# Patient Record
Sex: Female | Born: 1975 | Race: Black or African American | Hispanic: No | Marital: Single | Smoking: Never smoker
Health system: Southern US, Community
[De-identification: ages and names within clinical notes are randomized; demographics above are authoritative.]

## PROBLEM LIST (undated history)

## (undated) DIAGNOSIS — Z8679 Personal history of other diseases of the circulatory system: Secondary | ICD-10-CM

## (undated) DIAGNOSIS — F329 Major depressive disorder, single episode, unspecified: Secondary | ICD-10-CM

## (undated) DIAGNOSIS — F419 Anxiety disorder, unspecified: Secondary | ICD-10-CM

## (undated) DIAGNOSIS — G939 Disorder of brain, unspecified: Secondary | ICD-10-CM

## (undated) DIAGNOSIS — F32A Depression, unspecified: Secondary | ICD-10-CM

## (undated) DIAGNOSIS — J45909 Unspecified asthma, uncomplicated: Secondary | ICD-10-CM

## (undated) DIAGNOSIS — Z9889 Other specified postprocedural states: Secondary | ICD-10-CM

## (undated) HISTORY — PX: CARDIAC ELECTROPHYSIOLOGY MAPPING AND ABLATION: SHX1292

## (undated) HISTORY — DX: Disorder of brain, unspecified: G93.9

## (undated) HISTORY — DX: Depression, unspecified: F32.A

## (undated) HISTORY — DX: Anxiety disorder, unspecified: F41.9

## (undated) HISTORY — DX: Major depressive disorder, single episode, unspecified: F32.9

---

## 1999-01-09 ENCOUNTER — Inpatient Hospital Stay (HOSPITAL_COMMUNITY): Admission: EM | Admit: 1999-01-09 | Discharge: 1999-01-15 | Payer: Self-pay | Admitting: Emergency Medicine

## 1999-01-15 ENCOUNTER — Encounter (HOSPITAL_COMMUNITY): Admission: RE | Admit: 1999-01-15 | Discharge: 1999-04-15 | Payer: Self-pay | Admitting: Psychiatry

## 1999-03-12 ENCOUNTER — Encounter: Payer: Self-pay | Admitting: Emergency Medicine

## 1999-03-12 ENCOUNTER — Emergency Department (HOSPITAL_COMMUNITY): Admission: EM | Admit: 1999-03-12 | Discharge: 1999-03-12 | Payer: Self-pay | Admitting: Emergency Medicine

## 2000-03-24 ENCOUNTER — Emergency Department (HOSPITAL_COMMUNITY): Admission: EM | Admit: 2000-03-24 | Discharge: 2000-03-24 | Payer: Self-pay | Admitting: Emergency Medicine

## 2001-06-14 ENCOUNTER — Emergency Department (HOSPITAL_COMMUNITY): Admission: EM | Admit: 2001-06-14 | Discharge: 2001-06-14 | Payer: Self-pay | Admitting: Emergency Medicine

## 2007-10-06 ENCOUNTER — Encounter: Payer: Self-pay | Admitting: Internal Medicine

## 2007-10-11 ENCOUNTER — Encounter: Payer: Self-pay | Admitting: Internal Medicine

## 2008-02-23 ENCOUNTER — Encounter: Payer: Self-pay | Admitting: Internal Medicine

## 2008-03-05 ENCOUNTER — Encounter: Payer: Self-pay | Admitting: Internal Medicine

## 2008-03-13 ENCOUNTER — Encounter: Payer: Self-pay | Admitting: Internal Medicine

## 2008-03-23 ENCOUNTER — Encounter: Payer: Self-pay | Admitting: Internal Medicine

## 2008-09-01 ENCOUNTER — Emergency Department (HOSPITAL_COMMUNITY): Admission: EM | Admit: 2008-09-01 | Discharge: 2008-09-01 | Payer: Self-pay | Admitting: Emergency Medicine

## 2009-01-10 ENCOUNTER — Encounter: Payer: Self-pay | Admitting: Internal Medicine

## 2009-02-26 ENCOUNTER — Emergency Department (HOSPITAL_COMMUNITY): Admission: EM | Admit: 2009-02-26 | Discharge: 2009-02-26 | Payer: Self-pay | Admitting: Emergency Medicine

## 2009-04-02 DIAGNOSIS — I472 Ventricular tachycardia: Secondary | ICD-10-CM

## 2009-04-02 DIAGNOSIS — E7800 Pure hypercholesterolemia, unspecified: Secondary | ICD-10-CM

## 2009-04-03 ENCOUNTER — Ambulatory Visit: Payer: Self-pay | Admitting: Internal Medicine

## 2009-04-03 DIAGNOSIS — R0789 Other chest pain: Secondary | ICD-10-CM | POA: Insufficient documentation

## 2009-04-03 DIAGNOSIS — R55 Syncope and collapse: Secondary | ICD-10-CM

## 2009-04-03 DIAGNOSIS — R002 Palpitations: Secondary | ICD-10-CM | POA: Insufficient documentation

## 2009-04-26 ENCOUNTER — Emergency Department (HOSPITAL_COMMUNITY): Admission: EM | Admit: 2009-04-26 | Discharge: 2009-04-26 | Payer: Self-pay | Admitting: Emergency Medicine

## 2009-05-09 ENCOUNTER — Telehealth (INDEPENDENT_AMBULATORY_CARE_PROVIDER_SITE_OTHER): Payer: Self-pay | Admitting: *Deleted

## 2009-05-20 ENCOUNTER — Encounter (INDEPENDENT_AMBULATORY_CARE_PROVIDER_SITE_OTHER): Payer: Self-pay | Admitting: *Deleted

## 2009-05-21 ENCOUNTER — Emergency Department (HOSPITAL_COMMUNITY): Admission: EM | Admit: 2009-05-21 | Discharge: 2009-05-21 | Payer: Self-pay | Admitting: Family Medicine

## 2009-05-28 ENCOUNTER — Emergency Department (HOSPITAL_COMMUNITY): Admission: EM | Admit: 2009-05-28 | Discharge: 2009-05-28 | Payer: Self-pay | Admitting: Emergency Medicine

## 2009-05-30 ENCOUNTER — Telehealth: Payer: Self-pay | Admitting: Internal Medicine

## 2009-07-02 ENCOUNTER — Telehealth (INDEPENDENT_AMBULATORY_CARE_PROVIDER_SITE_OTHER): Payer: Self-pay | Admitting: *Deleted

## 2009-07-09 ENCOUNTER — Emergency Department (HOSPITAL_COMMUNITY): Admission: EM | Admit: 2009-07-09 | Discharge: 2009-07-09 | Payer: Self-pay | Admitting: Family Medicine

## 2009-08-07 ENCOUNTER — Telehealth (INDEPENDENT_AMBULATORY_CARE_PROVIDER_SITE_OTHER): Payer: Self-pay | Admitting: *Deleted

## 2009-11-01 ENCOUNTER — Emergency Department (HOSPITAL_COMMUNITY): Admission: EM | Admit: 2009-11-01 | Discharge: 2009-11-01 | Payer: Self-pay | Admitting: Emergency Medicine

## 2010-09-28 ENCOUNTER — Encounter: Payer: Self-pay | Admitting: Obstetrics

## 2010-11-26 LAB — CBC
Hemoglobin: 11.5 g/dL — ABNORMAL LOW (ref 12.0–15.0)
MCV: 76.2 fL — ABNORMAL LOW (ref 78.0–100.0)
RBC: 4.64 MIL/uL (ref 3.87–5.11)
WBC: 9.5 10*3/uL (ref 4.0–10.5)

## 2010-11-26 LAB — DIFFERENTIAL
Lymphs Abs: 3.6 10*3/uL (ref 0.7–4.0)
Monocytes Absolute: 0.7 10*3/uL (ref 0.1–1.0)
Monocytes Relative: 7 % (ref 3–12)
Neutro Abs: 5 10*3/uL (ref 1.7–7.7)
Neutrophils Relative %: 53 % (ref 43–77)

## 2010-11-26 LAB — BASIC METABOLIC PANEL
CO2: 24 mEq/L (ref 19–32)
Calcium: 8.9 mg/dL (ref 8.4–10.5)
Chloride: 107 mEq/L (ref 96–112)
Creatinine, Ser: 0.8 mg/dL (ref 0.4–1.2)
GFR calc Af Amer: >60 mL/min (ref >60)
Sodium: 137 mEq/L (ref 135–145)

## 2010-12-15 LAB — D-DIMER, QUANTITATIVE: D-Dimer, Quant: <0.22 ug/mL-FEU (ref 0.00–0.48)

## 2010-12-15 LAB — DIFFERENTIAL
Basophils Absolute: 0.3 10*3/uL — ABNORMAL HIGH (ref 0.0–0.1)
Eosinophils Absolute: 0.2 10*3/uL (ref 0.0–0.7)
Lymphocytes Relative: 45 % (ref 12–46)
Monocytes Relative: 6 % (ref 3–12)
Neutro Abs: 7.4 10*3/uL (ref 1.7–7.7)
Neutrophils Relative %: 46 % (ref 43–77)

## 2010-12-15 LAB — COMPREHENSIVE METABOLIC PANEL
Albumin: 3.7 g/dL (ref 3.5–5.2)
BUN: 11 mg/dL (ref 6–23)
Chloride: 105 mEq/L (ref 96–112)
Creatinine, Ser: 1.05 mg/dL (ref 0.4–1.2)
Glucose, Bld: 99 mg/dL (ref 70–99)
Total Bilirubin: 0.5 mg/dL (ref 0.3–1.2)

## 2010-12-15 LAB — PATHOLOGIST SMEAR REVIEW

## 2010-12-15 LAB — POCT CARDIAC MARKERS

## 2010-12-15 LAB — CBC
HCT: 36.5 % (ref 36.0–46.0)
MCHC: 32.8 g/dL (ref 30.0–36.0)
MCV: 76.1 fL — ABNORMAL LOW (ref 78.0–100.0)
RBC: 4.8 MIL/uL (ref 3.87–5.11)
WBC: 16.2 10*3/uL — ABNORMAL HIGH (ref 4.0–10.5)

## 2011-06-11 LAB — URINALYSIS, ROUTINE W REFLEX MICROSCOPIC
Glucose, UA: NEGATIVE mg/dL
Hgb urine dipstick: NEGATIVE
Ketones, ur: NEGATIVE mg/dL
Protein, ur: NEGATIVE mg/dL
Urobilinogen, UA: 0.2 mg/dL (ref 0.0–1.0)

## 2011-06-11 LAB — DIFFERENTIAL
Basophils Relative: 1 % (ref 0–1)
Eosinophils Absolute: 0.2 10*3/uL (ref 0.0–0.7)
Eosinophils Relative: 2 % (ref 0–5)
Lymphs Abs: 3.1 10*3/uL (ref 0.7–4.0)
Neutrophils Relative %: 57 % (ref 43–77)

## 2011-06-11 LAB — CBC
HCT: 35 % — ABNORMAL LOW (ref 36.0–46.0)
MCHC: 32.3 g/dL (ref 30.0–36.0)
MCV: 75.4 fL — ABNORMAL LOW (ref 78.0–100.0)
Platelets: 235 10*3/uL (ref 150–400)

## 2011-06-11 LAB — BASIC METABOLIC PANEL
BUN: 8 mg/dL (ref 6–23)
CO2: 24 mEq/L (ref 19–32)
Chloride: 106 mEq/L (ref 96–112)
Creatinine, Ser: 0.77 mg/dL (ref 0.4–1.2)
Glucose, Bld: 93 mg/dL (ref 70–99)
Potassium: 3.7 mEq/L (ref 3.5–5.1)

## 2011-06-11 LAB — URINE MICROSCOPIC-ADD ON

## 2014-01-11 ENCOUNTER — Encounter (HOSPITAL_COMMUNITY): Payer: Self-pay | Admitting: Emergency Medicine

## 2014-01-11 ENCOUNTER — Emergency Department (HOSPITAL_COMMUNITY)
Admission: EM | Admit: 2014-01-11 | Discharge: 2014-01-11 | Disposition: A | Discharge status: Home / Self Care | Payer: Medicaid Other | Source: Home / Self Care | Attending: Family Medicine | Admitting: Family Medicine

## 2014-01-11 DIAGNOSIS — H1010 Acute atopic conjunctivitis, unspecified eye: Secondary | ICD-10-CM

## 2014-01-11 DIAGNOSIS — J309 Allergic rhinitis, unspecified: Secondary | ICD-10-CM

## 2014-01-11 DIAGNOSIS — J45909 Unspecified asthma, uncomplicated: Secondary | ICD-10-CM

## 2014-01-11 HISTORY — DX: Unspecified asthma, uncomplicated: J45.909

## 2014-01-11 MED ORDER — FLUTICASONE PROPIONATE 50 MCG/ACT NA SUSP: 2.0000 | Freq: Every day | NASAL | Status: DC

## 2014-01-11 MED ORDER — ALBUTEROL SULFATE HFA 108 (90 BASE) MCG/ACT IN AERS: 2.0000 | INHALATION_SPRAY | Freq: Four times a day (QID) | RESPIRATORY_TRACT | Status: DC | PRN

## 2014-01-11 MED ORDER — CIPROFLOXACIN-DEXAMETHASONE 0.3-0.1 % OT SUSP: 4.0000 [drp] | Freq: Two times a day (BID) | OTIC | Status: DC | PRN

## 2014-01-11 NOTE — Discharge Instructions (Signed)
Thank you for coming in today. Take medications as prescribed.  Use over-the-counter Zyrtec or Claritin or Allegra for seasonal allergy symptoms. Use generic equivalents.  Use over-the-counter Zaditor eyedrops (Ketotifen) twice daily as needed for itching.   Allergic Conjunctivitis The conjunctiva is a thin membrane that covers the visible white part of the eyeball and the underside of the eyelids. This membrane protects and lubricates the eye. The membrane has small blood vessels running through it that can normally be seen. When the conjunctiva becomes inflamed, the condition is called conjunctivitis. In response to the inflammation, the conjunctival blood vessels become swollen. The swelling results in redness in the normally white part of the eye. The blood vessels of this membrane also react when a person has allergies and is then called allergic conjunctivitis. This condition usually lasts for as long as the allergy persists. Allergic conjunctivitis cannot be passed to another person (non-contagious). The likelihood of bacterial infection is great and the cause is not likely due to allergies if the inflamed eye has:  A sticky discharge.  Discharge or sticking together of the lids in the morning.  Scaling or flaking of the eyelids where the eyelashes come out.  Red swollen eyelids. CAUSES   Viruses.  Irritants such as foreign bodies.  Chemicals.  General allergic reactions.  Inflammation or serious diseases in the inside or the outside of the eye or the orbit (the boney cavity in which the eye sits) can cause a "red eye." SYMPTOMS   Eye redness.  Tearing.  Itchy eyes.  Burning feeling in the eyes.  Clear drainage from the eye.  Allergic reaction due to pollens or ragweed sensitivity. Seasonal allergic conjunctivitis is frequent in the spring when pollens are in the air and in the fall. DIAGNOSIS  This condition, in its many forms, is usually diagnosed based on the  history and an ophthalmological exam. It usually involves both eyes. If your eyes react at the same time every year, allergies may be the cause. While most "red eyes" are due to allergy or an infection, the role of an eye (ophthalmological) exam is important. The exam can rule out serious diseases of the eye or orbit. TREATMENT   Non-antibiotic eye drops, ointments, or medications by mouth may be prescribed if the ophthalmologist is sure the conjunctivitis is due to allergies alone.  Over-the-counter drops and ointments for allergic symptoms should be used only after other causes of conjunctivitis have been ruled out, or as your caregiver suggests. Medications by mouth are often prescribed if other allergy-related symptoms are present. If the ophthalmologist is sure that the conjunctivitis is due to allergies alone, treatment is normally limited to drops or ointments to reduce itching and burning. HOME CARE INSTRUCTIONS   Wash hands before and after applying drops or ointments, or touching the inflamed eye(s) or eyelids.  Do not let the eye dropper tip or ointment tube touch the eyelid when putting medicine in your eye.  Stop using your soft contact lenses and throw them away. Use a new pair of lenses when recovery is complete. You should run through sterilizing cycles at least three times before use after complete recovery if the old soft contact lenses are to be used. Hard contact lenses should be stopped. They need to be thoroughly sterilized before use after recovery.  Itching and burning eyes due to allergies is often relieved by using a cool cloth applied to closed eye(s). SEEK MEDICAL CARE IF:   Your problems do not go away  after two or three days of treatment.  Your lids are sticky (especially in the morning when you wake up) or stick together.  Discharge develops. Antibiotics may be needed either as drops, ointment, or by mouth.  You have extreme light sensitivity.  An oral  temperature above 102 F (38.9 C) develops.  Pain in or around the eye or any other visual symptom develops. MAKE SURE YOU:   Understand these instructions.  Will watch your condition.  Will get help right away if you are not doing well or get worse. Document Released: 11/14/2002 Document Revised: 11/16/2011 Document Reviewed: 10/10/2007 [facility] Patient Information 2014 [place], [place].  Allergic Rhinitis Allergic rhinitis is when the mucous membranes in the nose respond to allergens. Allergens are particles in the air that cause your body to have an allergic reaction. This causes you to release allergic antibodies. Through a chain of events, these eventually cause you to release histamine into the blood stream. Although meant to protect the body, it is this release of histamine that causes your discomfort, such as frequent sneezing, congestion, and an itchy, runny nose.  CAUSES  Seasonal allergic rhinitis (hay fever) is caused by pollen allergens that may come from grasses, trees, and weeds. Year-round allergic rhinitis (perennial allergic rhinitis) is caused by allergens such as house dust mites, pet dander, and mold spores.  SYMPTOMS   Nasal stuffiness (congestion).  Itchy, runny nose with sneezing and tearing of the eyes. DIAGNOSIS  Your health care provider can help you determine the allergen or allergens that trigger your symptoms. If you and your health care provider are unable to determine the allergen, skin or blood testing may be used. TREATMENT  Allergic Rhinitis does not have a cure, but it can be controlled by:  Medicines and allergy shots (immunotherapy).  Avoiding the allergen. Hay fever may often be treated with antihistamines in pill or nasal spray forms. Antihistamines block the effects of histamine. There are over-the-counter medicines that may help with nasal congestion and swelling around the eyes. Check with your health care provider before taking or giving this  medicine.  If avoiding the allergen or the medicine prescribed do not work, there are many new medicines your health care provider can prescribe. Stronger medicine may be used if initial measures are ineffective. Desensitizing injections can be used if medicine and avoidance does not work. Desensitization is when a patient is given ongoing shots until the body becomes less sensitive to the allergen. Make sure you follow up with your health care provider if problems continue. HOME CARE INSTRUCTIONS It is not possible to completely avoid allergens, but you can reduce your symptoms by taking steps to limit your exposure to them. It helps to know exactly what you are allergic to so that you can avoid your specific triggers. SEEK MEDICAL CARE IF:   You have a fever.  You develop a cough that does not stop easily (persistent).  You have shortness of breath.  You start wheezing.  Symptoms interfere with normal daily activities. Document Released: 05/19/2001 Document Revised: 06/14/2013 Document Reviewed: 05/01/2013 [facility] Patient Information 2014 [place], [place].

## 2014-01-11 NOTE — ED Provider Notes (Signed)
Carla Lawson is a 38 y.o. female who presents to Urgent Care today for allergies and asthma. Patient has a running nose and itchy watery eyes associated with a mild nonproductive cough. She's tried Sudafed which has not helped much. She additionally notes a history of asthma. She currently is not having any wheezing but does not have an albuterol inhaler and would like a refill if possible. She denies any shortness of breath fevers chills nausea vomiting or diarrhea.  Additionally patient has a history of occasional ear drainage bilaterally. This is been an ongoing problem for her for years. She denies significant pain. She denies any decreased hearing.   Past Medical History  Diagnosis Date  . Asthma    History  Substance Use Topics  . Smoking status: Never Smoker   . Smokeless tobacco: Not on file  . Alcohol Use: No   ROS as above Medications: No current facility-administered medications for this encounter.   Current Outpatient Prescriptions  Medication Sig Dispense Refill  . albuterol (PROVENTIL HFA;VENTOLIN HFA) 108 (90 BASE) MCG/ACT inhaler Inhale 2 puffs into the lungs every 6 (six) hours as needed for wheezing or shortness of breath.  1 Inhaler  2  . ciprofloxacin-dexamethasone (CIPRODEX) otic suspension Place 4 drops into both ears 2 (two) times daily as needed (drainage).  7.5 mL  1  . fluticasone (FLONASE) 50 MCG/ACT nasal spray Place 2 sprays into both nostrils daily.  16 g  2    Exam:  BP 115/82  Pulse 81  Temp(Src) 98.3 F (36.8 C) (Oral)  Resp 18  SpO2 98%  LMP 12/24/2013 Gen: Well NAD HEENT: EOMI,  MMM mild conjunctival injection bilaterally. Normal  posterior pharynx. Clear nasal discharge present. Tympanic membranes bilaterally with cerumen with a small amount of discharge. No effusion or erythema noted. Lungs: Normal work of breathing. CTABL Heart: RRR no MRG Abd: NABS, Soft. NT, ND Exts: Brisk capillary refill, warm and well perfused.    Assessment  and Plan: 38 y.o. female with allergic rhinitis and conjunctivitis associated with mild asthma. Plan to treat with over-the-counter Zyrtec and Zaditor eye drops. Additionally we'll use Flonase nasal spray and albuterol.  Patient likely has mild chronic otitis externa. Plan to use Ciprodex ear drops.  Recommendation followup with primary care provider.  Discussed warning signs or symptoms. Please see discharge instructions. Patient expresses understanding.    Rodolph Bong, MD 01/11/14 [phone]

## 2014-01-11 NOTE — ED Notes (Signed)
C/o medication refill on Proventil States seasonal allergies is making her cough.

## 2014-02-27 ENCOUNTER — Emergency Department (HOSPITAL_COMMUNITY)
Admission: EM | Admit: 2014-02-27 | Discharge: 2014-02-27 | Disposition: A | Discharge status: Home / Self Care | Payer: Medicaid Other | Attending: Emergency Medicine | Admitting: Emergency Medicine

## 2014-02-27 ENCOUNTER — Encounter (HOSPITAL_COMMUNITY): Payer: Self-pay | Admitting: Emergency Medicine

## 2014-02-27 ENCOUNTER — Emergency Department (HOSPITAL_COMMUNITY): Payer: Medicaid Other

## 2014-02-27 DIAGNOSIS — R5383 Other fatigue: Principal | ICD-10-CM

## 2014-02-27 DIAGNOSIS — R5381 Other malaise: Secondary | ICD-10-CM | POA: Insufficient documentation

## 2014-02-27 DIAGNOSIS — J45909 Unspecified asthma, uncomplicated: Secondary | ICD-10-CM | POA: Insufficient documentation

## 2014-02-27 DIAGNOSIS — R269 Unspecified abnormalities of gait and mobility: Secondary | ICD-10-CM | POA: Insufficient documentation

## 2014-02-27 DIAGNOSIS — R531 Weakness: Secondary | ICD-10-CM

## 2014-02-27 DIAGNOSIS — R6883 Chills (without fever): Secondary | ICD-10-CM | POA: Insufficient documentation

## 2014-02-27 LAB — BASIC METABOLIC PANEL
BUN: 10 mg/dL (ref 6–23)
CALCIUM: 9.1 mg/dL (ref 8.4–10.5)
CO2: 24 mEq/L (ref 19–32)
CREATININE: 0.81 mg/dL (ref 0.50–1.10)
Chloride: 102 mEq/L (ref 96–112)
Glucose, Bld: 126 mg/dL — ABNORMAL HIGH (ref 70–99)
POTASSIUM: 4.5 meq/L (ref 3.7–5.3)
Sodium: 138 mEq/L (ref 137–147)

## 2014-02-27 LAB — CBC
HEMATOCRIT: 35.4 % — AB (ref 36.0–46.0)
Hemoglobin: 11.2 g/dL — ABNORMAL LOW (ref 12.0–15.0)
MCH: 23.8 pg — AB (ref 26.0–34.0)
MCHC: 31.6 g/dL (ref 30.0–36.0)
MCV: 75.2 fL — AB (ref 78.0–100.0)
Platelets: 246 10*3/uL (ref 150–400)
RBC: 4.71 MIL/uL (ref 3.87–5.11)
RDW: 14.1 % (ref 11.5–15.5)
WBC: 10.2 10*3/uL (ref 4.0–10.5)

## 2014-02-27 NOTE — ED Notes (Signed)
Dr. Campos at bedside   

## 2014-02-27 NOTE — ED Notes (Addendum)
Bilateral leg pain began about 30 minutes ago also she stated, "I have pressure in my head"  Also noticed swelling in her legs.   Pt. Also reports having a hx of lightheaded, dizziness and syncope  She denies any chest pain or sob.

## 2014-02-27 NOTE — ED Provider Notes (Signed)
CSN: [id number]     Arrival date & time 02/27/14  1842 History   First MD Initiated Contact with Patient 02/27/14 1907     Chief Complaint  Patient presents with  . Leg Pain      HPI Patient states the past 7 years she's had intermittent episodes of generalized weakness and slow gait.  She states it is worsened over the past one to 2 days.  She's never had a clear diagnosis.  She states she seen a neurologist without an answer.  She had an MRI several years ago which demonstrated no abnormalities.  She denies change in her vision.  No chest pain or shortness breath.  Appear chills.  No abdominal pain, nausea vomiting or diarrhea.  She denies unilateral arm or leg weakness.  She denies unsteadiness or dizziness and states that her gait is simply slower.   Past Medical History  Diagnosis Date  . Asthma    History reviewed. No pertinent past surgical history. No family history on file. History  Substance Use Topics  . Smoking status: Never Smoker   . Smokeless tobacco: Not on file  . Alcohol Use: No   OB History   Grav Para Term Preterm Abortions TAB SAB Ect Mult Living                 Review of Systems  All other systems reviewed and are negative.     Allergies  Review of patient's allergies indicates no known allergies.  Home Medications   Prior to Admission medications   Medication Sig Start Date End Date Taking? Authorizing Provider  albuterol (PROVENTIL HFA;VENTOLIN HFA) 108 (90 BASE) MCG/ACT inhaler Inhale 2 puffs into the lungs every 6 (six) hours as needed for wheezing or shortness of breath. 01/11/14  Yes Rodolph BongEvan S Corey, MD   BP 115/73  Pulse 81  Temp(Src) 97.7 F (36.5 C) (Oral)  Resp 26  Ht 5\' 3"  (1.6 m)  Wt 260 lb (117.935 kg)  BMI 46.07 kg/m2  SpO2 100%  LMP 02/17/2014 Physical Exam  Nursing note and vitals reviewed. Constitutional: She is oriented to person, place, and time. She appears well-developed and well-nourished. No distress.  HENT:  Head:  Normocephalic and atraumatic.  Eyes: EOM are normal. Pupils are equal, round, and reactive to light.  Neck: Normal range of motion.  Cardiovascular: Normal rate, regular rhythm and normal heart sounds.   Pulmonary/Chest: Effort normal and breath sounds normal.  Abdominal: Soft. She exhibits no distension. There is no tenderness.  Musculoskeletal: Normal range of motion.  Neurological: She is alert and oriented to person, place, and time.  5/5 strength in major muscle groups of  bilateral upper and lower extremities. Speech normal. No facial asymetry.   Skin: Skin is warm and dry.  Psychiatric: She has a normal mood and affect. Judgment normal.    ED Course  Procedures (including critical care time) Labs Review Labs Reviewed  CBC - Abnormal; Notable for the following:    Hemoglobin 11.2 (*)    HCT 35.4 (*)    MCV 75.2 (*)    MCH 23.8 (*)    All other components within normal limits  BASIC METABOLIC PANEL - Abnormal; Notable for the following:    Glucose, Bld 126 (*)    All other components within normal limits    Imaging Review Mr Brain Wo Contrast  02/27/2014   CLINICAL DATA:  New onset of dizziness, gait instability, and intermittent weakness. Evaluate for multiple sclerosis.  EXAM:  MRI HEAD WITHOUT CONTRAST  TECHNIQUE: Multiplanar, multiecho pulse sequences of the brain and surrounding structures were obtained without intravenous contrast.  COMPARISON:  Head CT 11/01/2009  FINDINGS: There is no evidence of acute infarct, intracranial hemorrhage, mass, midline shift, or extra-axial fluid collection. There are several small foci of T2 hyperintensity within the subcortical and deep cerebral white matter bilaterally, the largest measuring 5 mm in the left frontal lobe (series 7, image 14). No periventricular, corpus callosum, or infratentorial lesions are identified. Ventricles and sulci are within normal limits for age.  Orbits are unremarkable. Paranasal sinuses and mastoid air cells  are clear. Diffuse skull thickening is again seen. Major intracranial vascular flow voids are preserved.  IMPRESSION: Several small white matter lesions, nonspecific. These could be seen in the setting of demyelinating disease, however none of these lesions are specifically characteristic for demyelination. Other considerations include small vessel ischemia, sequelae of trauma, hypercoagulable state, vasculitis, migraines, and prior infection.   Electronically Signed   By: Sebastian Ache   On: 02/27/2014 21:30  I personally reviewed the imaging tests through PACS system I reviewed available ER/hospitalization records through the EMR    EKG Interpretation None      MDM   Final diagnoses:  Weakness   Sideration for multiple sclerosis given her recurrent flares.  MRI will be obtained.  This her blood work.  Likely outpatient followup if all without significant abnormality  9:56 PM Nonspecific findings on MRI.  Patient will followup with neurology and develop a relationship with a primary care physician.  Medical screening examination completed.  Patient understands return to the ER for new or worsening symptoms    Lyanne Co, MD 02/27/14 2156

## 2014-03-03 ENCOUNTER — Emergency Department (HOSPITAL_COMMUNITY)
Admission: EM | Admit: 2014-03-03 | Discharge: 2014-03-03 | Disposition: A | Discharge status: Home / Self Care | Payer: Medicaid Other | Source: Home / Self Care

## 2014-03-03 ENCOUNTER — Encounter (HOSPITAL_COMMUNITY): Payer: Self-pay | Admitting: Emergency Medicine

## 2014-03-03 DIAGNOSIS — R5383 Other fatigue: Secondary | ICD-10-CM

## 2014-03-03 DIAGNOSIS — R5381 Other malaise: Secondary | ICD-10-CM

## 2014-03-03 DIAGNOSIS — R531 Weakness: Secondary | ICD-10-CM

## 2014-03-03 NOTE — ED Notes (Signed)
Patient declined blanket

## 2014-03-03 NOTE — ED Notes (Signed)
Right, mid back pain woke patient this am, "slept wrong".  Weakness, intermittent and lightheadedness are complaints.

## 2014-03-03 NOTE — ED Provider Notes (Signed)
CSN: [id number]     Arrival date & time 03/03/14  1518 History   First MD Initiated Contact with Patient 03/03/14 1543     Chief Complaint  Patient presents with  . Back Pain  . Weakness   (Consider location/radiation/quality/duration/timing/severity/associated sxs/prior Treatment) HPI Comments: Pt here st she left work today because of weakness and is wanting a work note. She had a thorough evaluation in the ED earlier this week to include MRI brain. She is to see neurologist soon. No new complaints today. St is aware there is nothing else that can be done for her in the urgent care.   Past Medical History  Diagnosis Date  . Asthma    History reviewed. No pertinent past surgical history. No family history on file. History  Substance Use Topics  . Smoking status: Never Smoker   . Smokeless tobacco: Not on file  . Alcohol Use: No   OB History   Grav Para Term Preterm Abortions TAB SAB Ect Mult Living                 Review of Systems  Constitutional: Positive for fatigue.  Neurological: Positive for weakness.  All other systems reviewed and are negative.   Allergies  Review of patient's allergies indicates no known allergies.  Home Medications   Prior to Admission medications   Medication Sig Start Date End Date Taking? Authorizing Provider  albuterol (PROVENTIL HFA;VENTOLIN HFA) 108 (90 BASE) MCG/ACT inhaler Inhale 2 puffs into the lungs every 6 (six) hours as needed for wheezing or shortness of breath. 01/11/14   Rodolph Bong, MD   BP 139/98  Pulse 90  Temp(Src) 98.3 F (36.8 C) (Oral)  Resp 20  SpO2 98%  LMP 02/25/2014 Physical Exam  Nursing note and vitals reviewed. Constitutional: She is oriented to person, place, and time. She appears well-developed.  Severe obesity   Eyes: Conjunctivae and EOM are normal.  Neck: Normal range of motion. Neck supple.  Cardiovascular: Normal rate, regular rhythm and normal heart sounds.   Pulmonary/Chest: Effort normal and  breath sounds normal. No respiratory distress. She has no wheezes. She has no rales.  Musculoskeletal: She exhibits no edema and no tenderness.  Neurological: She is alert and oriented to person, place, and time. She has normal strength. No sensory deficit. She exhibits normal muscle tone.  Extremity strength nl and symmetric  Skin: Skin is warm and dry.  Psychiatric: She has a normal mood and affect.    ED Course  Procedures (including critical care time) Labs Review Labs Reviewed - No data to display  Imaging Review No results found.   MDM   1. Weakness generalized     Reviewed recent MR from the ED visit this week. Pt with no new complaints. Note that she was here today    Hayden Rasmussen, NP 03/03/14 1556

## 2014-03-03 NOTE — Discharge Instructions (Signed)
Fatigue Fatigue is a feeling of tiredness, lack of energy, lack of motivation, or feeling tired all the time. Having enough rest, good nutrition, and reducing stress will normally reduce fatigue. Consult your caregiver if it persists. The nature of your fatigue will help your caregiver to find out its cause. The treatment is based on the cause.  CAUSES  There are many causes for fatigue. Most of the time, fatigue can be traced to one or more of your habits or routines. Most causes fit into one or more of three general areas. They are: Lifestyle problems  Sleep disturbances.  Overwork.  Physical exertion.  Unhealthy habits.  Poor eating habits or eating disorders.  Alcohol and/or drug use .  Lack of proper nutrition (malnutrition). Psychological problems  Stress and/or anxiety problems.  Depression.  Grief.  Boredom. Medical Problems or Conditions  Anemia.  Pregnancy.  Thyroid gland problems.  Recovery from major surgery.  Continuous pain.  Emphysema or asthma that is not well controlled  Allergic conditions.  Diabetes.  Infections (such as mononucleosis).  Obesity.  Sleep disorders, such as sleep apnea.  Heart failure or other heart-related problems.  Cancer.  Kidney disease.  Liver disease.  Effects of certain medicines such as antihistamines, cough and cold remedies, prescription pain medicines, heart and blood pressure medicines, drugs used for treatment of cancer, and some antidepressants. SYMPTOMS  The symptoms of fatigue include:   Lack of energy.  Lack of drive (motivation).  Drowsiness.  Feeling of indifference to the surroundings. DIAGNOSIS  The details of how you feel help guide your caregiver in finding out what is causing the fatigue. You will be asked about your present and past health condition. It is important to review all medicines that you take, including prescription and non-prescription items. A thorough exam will be done.  You will be questioned about your feelings, habits, and normal lifestyle. Your caregiver may suggest blood tests, urine tests, or other tests to look for common medical causes of fatigue.  TREATMENT  Fatigue is treated by correcting the underlying cause. For example, if you have continuous pain or depression, treating these causes will improve how you feel. Similarly, adjusting the dose of certain medicines will help in reducing fatigue.  HOME CARE INSTRUCTIONS   Try to get the required amount of good sleep every night.  Eat a healthy and nutritious diet, and drink enough water throughout the day.  Practice ways of relaxing (including yoga or meditation).  Exercise regularly.  Make plans to change situations that cause stress. Act on those plans so that stresses decrease over time. Keep your work and personal routine reasonable.  Avoid street drugs and minimize use of alcohol.  Start taking a daily multivitamin after consulting your caregiver. SEEK MEDICAL CARE IF:   You have persistent tiredness, which cannot be accounted for.  You have fever.  You have unintentional weight loss.  You have headaches.  You have disturbed sleep throughout the night.  You are feeling sad.  You have constipation.  You have dry skin.  You have gained weight.  You are taking any new or different medicines that you suspect are causing fatigue.  You are unable to sleep at night.  You develop any unusual swelling of your legs or other parts of your body. SEEK IMMEDIATE MEDICAL CARE IF:   You are feeling confused.  Your vision is blurred.  You feel faint or pass out.  You develop severe headache.  You develop severe abdominal, pelvic, or  back pain.  You develop chest pain, shortness of breath, or an irregular or fast heartbeat.  You are unable to pass a normal amount of urine.  You develop abnormal bleeding such as bleeding from the rectum or you vomit blood.  You have thoughts  about harming yourself or committing suicide.  You are worried that you might harm someone else. MAKE SURE YOU:   Understand these instructions.  Will watch your condition.  Will get help right away if you are not doing well or get worse. Document Released: 06/21/2007 Document Revised: 11/16/2011 Document Reviewed: 06/21/2007 [facility] Patient Information 2015 [place], [place]. This information is not intended to replace advice given to you by your health care provider. Make sure you discuss any questions you have with your health care provider.  Weakness Weakness is a lack of strength. It may be felt all over the body (generalized) or in one specific part of the body (focal). Some causes of weakness can be serious. You may need further medical evaluation, especially if you are elderly or you have a history of immunosuppression (such as chemotherapy or HIV), kidney disease, heart disease, or diabetes. CAUSES  Weakness can be caused by many different things, including:  Infection.  Physical exhaustion.  Internal bleeding or other blood loss that results in a lack of red blood cells (anemia).  Dehydration. This cause is more common in elderly people.  Side effects or electrolyte abnormalities from medicines, such as pain medicines or sedatives.  Emotional distress, anxiety, or depression.  Circulation problems, especially severe peripheral arterial disease.  Heart disease, such as rapid atrial fibrillation, bradycardia, or heart failure.  Nervous system disorders, such as Guillain-Barr syndrome, multiple sclerosis, or stroke. DIAGNOSIS  To find the cause of your weakness, your caregiver will take your history and perform a physical exam. Lab tests or X-rays may also be ordered, if needed. TREATMENT  Treatment of weakness depends on the cause of your symptoms and can vary greatly. HOME CARE INSTRUCTIONS   Rest as needed.  Eat a well-balanced diet.  Try to get some exercise  every day.  Only take over-the-counter or prescription medicines as directed by your caregiver. SEEK MEDICAL CARE IF:   Your weakness seems to be getting worse or spreads to other parts of your body.  You develop new aches or pains. SEEK IMMEDIATE MEDICAL CARE IF:   You cannot perform your normal daily activities, such as getting dressed and feeding yourself.  You cannot walk up and down stairs, or you feel exhausted when you do so.  You have shortness of breath or chest pain.  You have difficulty moving parts of your body.  You have weakness in only one area of the body or on only one side of the body.  You have a fever.  You have trouble speaking or swallowing.  You cannot control your bladder or bowel movements.  You have black or bloody vomit or stools. MAKE SURE YOU:  Understand these instructions.  Will watch your condition.  Will get help right away if you are not doing well or get worse. Document Released: 08/24/2005 Document Revised: 02/23/2012 Document Reviewed: 10/23/2011 [facility] Patient Information 2015 [place], [place]. This information is not intended to replace advice given to you by your health care provider. Make sure you discuss any questions you have with your health care provider.

## 2014-03-05 NOTE — ED Provider Notes (Signed)
Medical screening examination/treatment/procedure(s) were performed by resident physician or non-physician practitioner and as supervising physician I was immediately available for consultation/collaboration.   KINDL,JAMES DOUGLAS MD.   James D Kindl, MD 03/05/14 2120 

## 2014-03-12 ENCOUNTER — Other Ambulatory Visit: Payer: Self-pay

## 2014-03-12 ENCOUNTER — Encounter (HOSPITAL_COMMUNITY): Payer: Self-pay | Admitting: Emergency Medicine

## 2014-03-12 ENCOUNTER — Emergency Department (HOSPITAL_COMMUNITY)
Admission: EM | Admit: 2014-03-12 | Discharge: 2014-03-13 | Disposition: A | Discharge status: Home / Self Care | Payer: Medicaid Other | Attending: Emergency Medicine | Admitting: Emergency Medicine

## 2014-03-12 DIAGNOSIS — Z79899 Other long term (current) drug therapy: Secondary | ICD-10-CM | POA: Diagnosis not present

## 2014-03-12 DIAGNOSIS — Z3202 Encounter for pregnancy test, result negative: Secondary | ICD-10-CM | POA: Diagnosis not present

## 2014-03-12 DIAGNOSIS — R0602 Shortness of breath: Secondary | ICD-10-CM | POA: Diagnosis present

## 2014-03-12 DIAGNOSIS — M79609 Pain in unspecified limb: Secondary | ICD-10-CM | POA: Insufficient documentation

## 2014-03-12 DIAGNOSIS — R064 Hyperventilation: Secondary | ICD-10-CM

## 2014-03-12 DIAGNOSIS — J45909 Unspecified asthma, uncomplicated: Secondary | ICD-10-CM | POA: Diagnosis not present

## 2014-03-12 DIAGNOSIS — R42 Dizziness and giddiness: Secondary | ICD-10-CM | POA: Diagnosis not present

## 2014-03-12 HISTORY — DX: Other specified postprocedural states: Z98.890

## 2014-03-12 HISTORY — DX: Personal history of other diseases of the circulatory system: Z86.79

## 2014-03-12 LAB — BASIC METABOLIC PANEL
Anion gap: 13 (ref 5–15)
BUN: 9 mg/dL (ref 6–23)
CALCIUM: 9.4 mg/dL (ref 8.4–10.5)
CO2: 25 mEq/L (ref 19–32)
Chloride: 102 mEq/L (ref 96–112)
Creatinine, Ser: 0.88 mg/dL (ref 0.50–1.10)
GFR, EST NON AFRICAN AMERICAN: 82 mL/min — AB (ref >90)
Glucose, Bld: 138 mg/dL — ABNORMAL HIGH (ref 70–99)
POTASSIUM: 4.2 meq/L (ref 3.7–5.3)
SODIUM: 140 meq/L (ref 137–147)

## 2014-03-12 LAB — CBC
HCT: 36.4 % (ref 36.0–46.0)
Hemoglobin: 11.8 g/dL — ABNORMAL LOW (ref 12.0–15.0)
MCH: 24.3 pg — ABNORMAL LOW (ref 26.0–34.0)
MCHC: 32.4 g/dL (ref 30.0–36.0)
MCV: 75.1 fL — ABNORMAL LOW (ref 78.0–100.0)
PLATELETS: 263 10*3/uL (ref 150–400)
RBC: 4.85 MIL/uL (ref 3.87–5.11)
RDW: 14.3 % (ref 11.5–15.5)
WBC: 11.6 10*3/uL — ABNORMAL HIGH (ref 4.0–10.5)

## 2014-03-12 LAB — I-STAT TROPONIN, ED: TROPONIN I, POC: 0 ng/mL (ref 0.00–0.08)

## 2014-03-12 LAB — POC URINE PREG, ED: Preg Test, Ur: NEGATIVE

## 2014-03-12 NOTE — ED Provider Notes (Signed)
CSN: [id number]     Arrival date & time 03/12/14  1915 History   First MD Initiated Contact with Patient 03/12/14 2304     Chief Complaint  Patient presents with  . Shortness of Breath  . Leg Pain     (Consider location/radiation/quality/duration/timing/severity/associated sxs/prior Treatment) HPI 38 year old female presents to emergency room with complaint of 10 minute episode of irregular breathing and dizziness.  Patient reports she was at work around 6 PM, when she began to hyperventilate.  Patient reports she was unable to control her breathing.  She denies any stress or anxiety causing his symptoms.  With that she became dizzy and lightheaded.  Symptoms resolved on their own after about 10 minutes.  Patient recently seen in the emergency department due to slow gait which has been steadily worsening over the last several years, with acute worsening over the last several weeks.  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  Patient reports that she is awaiting a followup with neurology at this time.  She reports no specific change in her weakness and slow gait.  She denies any upper extremity symptoms.  Patient denies any caffeine use, no history of panic attacks, no prior history of hyperventilation.  Patient reports all breathing symptoms have resolved Past Medical History  Diagnosis Date  . Asthma   . S/P ablation of ventricular arrhythmia    History reviewed. No pertinent past surgical history. History reviewed. No pertinent family history. History  Substance Use Topics  . Smoking status: Never Smoker   . Smokeless tobacco: Not on file  . Alcohol Use: No   OB History   Grav Para Term Preterm Abortions TAB SAB Ect Mult Living                 Review of Systems   See History of Present Illness; otherwise all other systems are reviewed and negative  Allergies  Review of patient's allergies indicates no known allergies.  Home  Medications   Prior to Admission medications   Medication Sig Start Date End Date Taking? Authorizing Provider  albuterol (PROVENTIL HFA;VENTOLIN HFA) 108 (90 BASE) MCG/ACT inhaler Inhale 2 puffs into the lungs every 6 (six) hours as needed for wheezing or shortness of breath. 01/11/14  Yes Rodolph Bong, MD   BP 119/74  Pulse 87  Temp(Src) 97.8 F (36.6 C) (Oral)  Resp 18  Ht 5\' 3"  (1.6 m)  Wt 260 lb (117.935 kg)  BMI 46.07 kg/m2  SpO2 100%  LMP 02/25/2014 Physical Exam  Nursing note and vitals reviewed. Constitutional: She is oriented to person, place, and time. She appears well-developed and well-nourished. No distress.  HENT:  Head: Normocephalic and atraumatic.  Right Ear: External ear normal.  Left Ear: External ear normal.  Nose: Nose normal.  Mouth/Throat: Oropharynx is clear and moist.  Eyes: Conjunctivae and EOM are normal. Pupils are equal, round, and reactive to light.  Neck: Normal range of motion. Neck supple. No JVD present. No tracheal deviation present. No thyromegaly present.  Cardiovascular: Normal rate, regular rhythm, normal heart sounds and intact distal pulses.  Exam reveals no gallop and no friction rub.   No murmur heard. Pulmonary/Chest: Effort normal and breath sounds normal. No stridor. No respiratory distress. She has no wheezes. She has no rales. She exhibits no tenderness.  Abdominal: Soft. Bowel sounds are normal. She exhibits no distension and no mass. There is no tenderness. There is no rebound and  no guarding.  Musculoskeletal: Normal range of motion. She exhibits no edema and no tenderness.  Lymphadenopathy:    She has no cervical adenopathy.  Neurological: She is alert and oriented to person, place, and time. She has normal reflexes. No cranial nerve deficit. She exhibits normal muscle tone. Coordination (patient has slow movement of her lower extremities. she has normal strength.  There is no cogwheel rigidity.) abnormal.  Skin: Skin is warm and  dry. No rash noted. She is not diaphoretic. No erythema. No pallor.  Psychiatric: She has a normal mood and affect. Her behavior is normal. Judgment and thought content normal.    ED Course  Procedures (including critical care time) Labs Review Labs Reviewed  CBC - Abnormal; Notable for the following:    WBC 11.6 (*)    Hemoglobin 11.8 (*)    MCV 75.1 (*)    MCH 24.3 (*)    All other components within normal limits  BASIC METABOLIC PANEL - Abnormal; Notable for the following:    Glucose, Bld 138 (*)    GFR calc non Af Amer 82 (*)    All other components within normal limits  I-STAT TROPOININ, ED  POC URINE PREG, ED    Imaging Review No results found.   EKG Interpretation   Date/Time:  Tuesday March 13 2014 00:04:10 EDT Ventricular Rate:  80 PR Interval:  142 QRS Duration: 91 QT Interval:  391 QTC Calculation: 451 R Axis:   30 Text Interpretation:  Sinus rhythm RSR' in V1 or V2, right VCD or RVH  Confirmed by Amory Simonetti  MD, Kenzleigh Sedam ([id number]) on 03/13/2014 12:07:31 AM       Date: 03/12/2014 19:23  Rate: 113  Rhythm: normal sinus rhythm, premature atrial contractions (PAC) and premature ventricular contractions (PVC)  QRS Axis: normal  Intervals: normal  ST/T Wave abnormalities: normal  Conduction Disutrbances:none  Narrative Interpretation:   Old EKG Reviewed: changes noted, name mismatch   MDM   Final diagnoses:  Hyperventilation  Dizziness, nonspecific    38 year old female with unremarkable workup in the emergency department other than her ongoing slowed movements of her lower extremity.  EKG does show some PVCs and PACs, with initial rate of 113 upon arrival, her rate has normalized during her stay here.  Patient strongly encouraged to contact neurology for sooner appointment if possible for further evaluation for her ongoing neuro issues.    Olivia Mackielga M Taffany Heiser, MD 03/13/14 [phone]

## 2014-03-12 NOTE — Discharge Instructions (Signed)
Dizziness °Dizziness is a common problem. It is a feeling of unsteadiness or light-headedness. You may feel like you are about to faint. Dizziness can lead to injury if you stumble or fall. A person of any age group can suffer from dizziness, but dizziness is more common in older adults. °CAUSES  °Dizziness can be caused by many different things, including: °· Middle ear problems. °· Standing for too long. °· Infections. °· An allergic reaction. °· Aging. °· An emotional response to something, such as the sight of blood. °· Side effects of medicines. °· Tiredness. °· Problems with circulation or blood pressure. °· Excessive use of alcohol or medicines, or illegal drug use. °· Breathing too fast (hyperventilation). °· An irregular heart rhythm (arrhythmia). °· A low red blood cell count (anemia). °· Pregnancy. °· Vomiting, diarrhea, fever, or other illnesses that cause body fluid loss (dehydration). °· Diseases or conditions such as Parkinson's disease, high blood pressure (hypertension), diabetes, and thyroid problems. °· Exposure to extreme heat. °DIAGNOSIS  °Your health care provider will ask about your symptoms, perform a physical exam, and perform an electrocardiogram (ECG) to record the electrical activity of your heart. Your health care provider may also perform other heart or blood tests to determine the cause of your dizziness. These may include: °· Transthoracic echocardiogram (TTE). During echocardiography, sound waves are used to evaluate how blood flows through your heart. °· Transesophageal echocardiogram (TEE). °· Cardiac monitoring. This allows your health care provider to monitor your heart rate and rhythm in real time. °· Holter monitor. This is a portable device that records your heartbeat and can help diagnose heart arrhythmias. It allows your health care provider to track your heart activity for several days if needed. °· Stress tests by exercise or by giving medicine that makes the heart beat  faster. °TREATMENT  °Treatment of dizziness depends on the cause of your symptoms and can vary greatly. °HOME CARE INSTRUCTIONS  °· Drink enough fluids to keep your urine clear or pale yellow. This is especially important in very hot weather. In older adults, it is also important in cold weather. °· Take your medicine exactly as directed if your dizziness is caused by medicines. When taking blood pressure medicines, it is especially important to get up slowly. °¨ Rise slowly from chairs and steady yourself until you feel okay. °¨ In the morning, first sit up on the side of the bed. When you feel okay, stand slowly while holding onto something until you know your balance is fine. °· Move your legs often if you need to stand in one place for a long time. Tighten and relax your muscles in your legs while standing. °· Have someone stay with you for 1-2 days if dizziness continues to be a problem. Do this until you feel you are well enough to stay alone. Have the person call your health care provider if he or she notices changes in you that are concerning. °· Do not drive or use heavy machinery if you feel dizzy. °· Do not drink alcohol. °SEEK IMMEDIATE MEDICAL CARE IF:  °· Your dizziness or light-headedness gets worse. °· You feel nauseous or vomit. °· You have problems talking, walking, or using your arms, hands, or legs. °· You feel weak. °· You are not thinking clearly or you have trouble forming sentences. It may take a friend or family member to notice this. °· You have chest pain, abdominal pain, shortness of breath, or sweating. °· Your vision changes. °· You notice   any bleeding.  You have side effects from medicine that seems to be getting worse rather than better. MAKE SURE YOU:   Understand these instructions.  Will watch your condition.  Will get help right away if you are not doing well or get worse. Document Released: 02/17/2001 Document Revised: 08/29/2013 Document Reviewed: 03/13/2011 [facility]  Patient Information 2015 [place], [place]. This information is not intended to replace advice given to you by your health care provider. Make sure you discuss any questions you have with your health care provider.  Hyperventilation Hyperventilation is breathing that is deeper and more rapid than normal. It is usually associated with panic and anxiety. Hyperventilation can make you feel breathless. It is sometimes called overbreathing. Breathing out too much causes a decrease in the amount of carbon dioxide gas in the blood. This leads to tingling and numbness in the hands, feet, and around the mouth. If this continues, your fingers, hands, and toes may begin to spasm. Hyperventilation usually lasts 20-30 minutes and can be associated with other symptoms of panic and anxiety, including:   Chest pains or tightness.  A pounding or irregular, racing heartbeat (palpitations).  Dizziness.  Lightheadedness.  Dry mouth.  Weakness.  Confusion.  Sleep disturbance. CAUSES  Sudden onset (acute) hyperventilation is usually triggered by acute stress, anxiety, or emotional upset. Long-term (chronic) and recurring hyperventilation can occur with chronic lung problems, such emphysema or asthma. Other causes include:   Nervousness.  Stress.  Stimulant, drug, or alcohol use.  Lung disease.  Infections, such as pneumonia.  Heart problems.  Severe pain.  Waking from a bad dream.  Pregnancy.  Bleeding. HOME CARE INSTRUCTIONS  Learn and use breathing exercises that help you breathe from your diaphragm and abdomen.  Practice relaxation techniques to reduce stress, such as visualization, meditation, and muscle release.  During an attack, try breathing into a paper bag. This changes the carbon dioxide level and slows down breathing. SEEK IMMEDIATE MEDICAL CARE IF:  Your hyperventilation continues or gets worse. MAKE SURE YOU:  Understand these instructions.  Will watch your  condition.  Will get help right away if you are not doing well or get worse. Document Released: 08/21/2000 Document Revised: 02/23/2012 Document Reviewed: 12/03/2011 [facility] Patient Information 2015 [place], [place]. This information is not intended to replace advice given to you by your health care provider. Make sure you discuss any questions you have with your health care provider.

## 2014-03-12 NOTE — ED Notes (Addendum)
Presents with onset of SOB and dizziness began about 1 hour ago and has resolved. Recently DX with a brain lesion. For the past 4 days reports bilateral leg pain and weakness, resulting in a fall from standing yesterday onto the carpet. Denies LOC and hitting head with fall. Denies SOB and dizziness at this time. Pulse noted to be irregular by EMS, PVC noted to monitor .No deformities. Denies chest pain.

## 2014-03-16 ENCOUNTER — Encounter (HOSPITAL_COMMUNITY): Payer: Self-pay | Admitting: Emergency Medicine

## 2014-03-16 ENCOUNTER — Emergency Department (HOSPITAL_COMMUNITY)
Admission: EM | Admit: 2014-03-16 | Discharge: 2014-03-16 | Disposition: A | Discharge status: Home / Self Care | Payer: Medicaid Other | Attending: Emergency Medicine | Admitting: Emergency Medicine

## 2014-03-16 DIAGNOSIS — J45909 Unspecified asthma, uncomplicated: Secondary | ICD-10-CM | POA: Insufficient documentation

## 2014-03-16 DIAGNOSIS — R209 Unspecified disturbances of skin sensation: Secondary | ICD-10-CM | POA: Diagnosis not present

## 2014-03-16 DIAGNOSIS — R5383 Other fatigue: Secondary | ICD-10-CM | POA: Diagnosis not present

## 2014-03-16 DIAGNOSIS — R5381 Other malaise: Secondary | ICD-10-CM | POA: Insufficient documentation

## 2014-03-16 DIAGNOSIS — Z79899 Other long term (current) drug therapy: Secondary | ICD-10-CM | POA: Insufficient documentation

## 2014-03-16 DIAGNOSIS — R531 Weakness: Secondary | ICD-10-CM

## 2014-03-16 LAB — URINALYSIS, ROUTINE W REFLEX MICROSCOPIC
BILIRUBIN URINE: NEGATIVE
GLUCOSE, UA: NEGATIVE mg/dL
HGB URINE DIPSTICK: NEGATIVE
Ketones, ur: NEGATIVE mg/dL
Leukocytes, UA: NEGATIVE
Nitrite: NEGATIVE
PH: 6 (ref 5.0–8.0)
PROTEIN: NEGATIVE mg/dL
Specific Gravity, Urine: 1.02 (ref 1.005–1.030)
Urobilinogen, UA: 1 mg/dL (ref 0.0–1.0)

## 2014-03-16 LAB — CBC WITH DIFFERENTIAL/PLATELET
BASOS PCT: 0 % (ref 0–1)
Basophils Absolute: 0 10*3/uL (ref 0.0–0.1)
EOS ABS: 0.2 10*3/uL (ref 0.0–0.7)
Eosinophils Relative: 2 % (ref 0–5)
HEMATOCRIT: 34.2 % — AB (ref 36.0–46.0)
Hemoglobin: 11 g/dL — ABNORMAL LOW (ref 12.0–15.0)
Lymphocytes Relative: 35 % (ref 12–46)
Lymphs Abs: 3.6 10*3/uL (ref 0.7–4.0)
MCH: 23.9 pg — AB (ref 26.0–34.0)
MCHC: 32.2 g/dL (ref 30.0–36.0)
MCV: 74.2 fL — ABNORMAL LOW (ref 78.0–100.0)
MONO ABS: 0.5 10*3/uL (ref 0.1–1.0)
Monocytes Relative: 5 % (ref 3–12)
NEUTROS ABS: 6 10*3/uL (ref 1.7–7.7)
NEUTROS PCT: 58 % (ref 43–77)
Platelets: 252 10*3/uL (ref 150–400)
RBC: 4.61 MIL/uL (ref 3.87–5.11)
RDW: 14.1 % (ref 11.5–15.5)
WBC: 10.3 10*3/uL (ref 4.0–10.5)

## 2014-03-16 LAB — BASIC METABOLIC PANEL
Anion gap: 13 (ref 5–15)
BUN: 11 mg/dL (ref 6–23)
CALCIUM: 9.2 mg/dL (ref 8.4–10.5)
CO2: 25 mEq/L (ref 19–32)
Chloride: 100 mEq/L (ref 96–112)
Creatinine, Ser: 0.92 mg/dL (ref 0.50–1.10)
GFR calc Af Amer: 90 mL/min — ABNORMAL LOW (ref >90)
GFR, EST NON AFRICAN AMERICAN: 77 mL/min — AB (ref >90)
GLUCOSE: 122 mg/dL — AB (ref 70–99)
Potassium: 4 mEq/L (ref 3.7–5.3)
Sodium: 138 mEq/L (ref 137–147)

## 2014-03-16 LAB — PREGNANCY, URINE: Preg Test, Ur: NEGATIVE

## 2014-03-16 MED ORDER — SODIUM CHLORIDE 0.9 % IV SOLN
1000.0000 mL | Freq: Once | INTRAVENOUS | Status: AC
  Administered 2014-03-16: 1000 mL via INTRAVENOUS

## 2014-03-16 MED ORDER — SODIUM CHLORIDE 0.9 % IV SOLN
1000.0000 mL | INTRAVENOUS | Status: DC
Start: 2014-03-16 — End: 2014-03-17

## 2014-03-16 NOTE — ED Provider Notes (Addendum)
CSN: [id number]     Arrival date & time 03/16/14  1827 History   First MD Initiated Contact with Patient 03/16/14 1842     Chief Complaint  Patient presents with  . Weakness    HPI Pt was at work today.  She developed sudden onset where she started to have tingling, numbness and weakness in her legs.  This has been ongoing off and on for years.  Sometimes the symptoms will move up towards her hands.  She had an MRI recently that showed some nonspecific lesions.  She is waiting to see a neurologist about the possibility of MS.  She had an episode last week where she went to the hospital.  The symptoms resolved after a period of time.  She has been trying to stay hydrated and has avoided caffeine. The symptoms today have lasted for a few hours.  She still feels like her legs are tight and tingling.    Past Medical History  Diagnosis Date  . Asthma   . S/P ablation of ventricular arrhythmia    History reviewed. No pertinent past surgical history. History reviewed. No pertinent family history. History  Substance Use Topics  . Smoking status: Never Smoker   . Smokeless tobacco: Not on file  . Alcohol Use: No   OB History   Grav Para Term Preterm Abortions TAB SAB Ect Mult Living                 Review of Systems  All other systems reviewed and are negative.     Allergies  Review of patient's allergies indicates no known allergies.  Home Medications   Prior to Admission medications   Medication Sig Start Date End Date Taking? Authorizing Provider  albuterol (PROVENTIL HFA;VENTOLIN HFA) 108 (90 BASE) MCG/ACT inhaler Inhale 2 puffs into the lungs every 6 (six) hours as needed for wheezing or shortness of breath. 01/11/14  Yes Rodolph BongEvan S Corey, MD   BP 116/67  Pulse 96  Temp(Src) 98.7 F (37.1 C) (Oral)  Resp 20  SpO2 100%  LMP 02/25/2014 Physical Exam  Nursing note and vitals reviewed. Constitutional: She appears well-developed and well-nourished. No distress.  HENT:  Head:  Normocephalic and atraumatic.  Right Ear: External ear normal.  Left Ear: External ear normal.  Eyes: Conjunctivae are normal. Right eye exhibits no discharge. Left eye exhibits no discharge. No scleral icterus.  Neck: Neck supple. No tracheal deviation present.  Cardiovascular: Normal rate, regular rhythm and intact distal pulses.   Pulmonary/Chest: Effort normal and breath sounds normal. No stridor. No respiratory distress. She has no wheezes. She has no rales.  Abdominal: Soft. Bowel sounds are normal. She exhibits no distension. There is no tenderness. There is no rebound and no guarding.  Musculoskeletal: She exhibits no edema and no tenderness.  Neurological: She is alert. She has normal strength. No cranial nerve deficit (no facial droop, extraocular movements intact, no slurred speech) or sensory deficit. She exhibits normal muscle tone. She displays no seizure activity. Coordination normal.  Slow movements in all 4 extremities, decreased grip strength right hand, decreased strength lifting both legs off the bed, normal sensation, no facial droop  Skin: Skin is warm and dry. No rash noted.  Psychiatric: She has a normal mood and affect.    ED Course  Procedures (including critical care time)  Labs Reviewed  CBC WITH DIFFERENTIAL - Abnormal; Notable for the following:    Hemoglobin 11.0 (*)    HCT 34.2 (*)  MCV 74.2 (*)    MCH 23.9 (*)    All other components within normal limits  BASIC METABOLIC PANEL - Abnormal; Notable for the following:    Glucose, Bld 122 (*)    GFR calc non Af Amer 77 (*)    GFR calc Af Amer 90 (*)    All other components within normal limits  PREGNANCY, URINE  URINALYSIS, ROUTINE W REFLEX MICROSCOPIC    MDM   Final diagnoses:  Weakness    Pt has been having intermittent episodes of years.  She has seen a neurologist at [facility] in the past and was given a prescription but the patient states she was never examined and  never had any tests.  She has also seen neurologists and cardiologist at other medical centers.  The patient had a recent MRI in June ordered by Dr Patria Mane.  There were nonspecific findings.  She is scheduled to see a neurologist.  The patient will likely need some spine imaging as well considering her lower extrem focused complaints now however considering the chronicity of her symptoms I do not feel emergent MRI testing is necessary.  Pt has not been able to see [facility] neurology because of bill issues.  Will refer to [place] neurology.  PCP resources were also given.   Will make sure pt can safely ambulate.  Linwood Dibbles, MD 03/16/14 2045

## 2014-03-16 NOTE — Progress Notes (Signed)
  CARE MANAGEMENT ED NOTE 03/16/2014  Patient:  MAISON, KUHNS   Account Number:  [id number]  Date Initiated:  03/16/2014  Documentation initiated by:  Radford Pax  Subjective/Objective Assessment:   Patient presents to Ed with tingling, numbness and weakness in legs.     Subjective/Objective Assessment Detail:     Action/Plan:   Action/Plan Detail:   Anticipated DC Date:       Status Recommendation to Physician:   Result of Recommendation:    Other ED Services  Consult Working Plan    DC Planning Services  Other  PCP issues    Choice offered to / List presented to:            Status of service:  Completed, signed off  ED Comments:   ED Comments Detail:  EDCM spoke to patient at bedside.  Patient confirms she does not have a pcp with Medicaid insurance.  [facility] provided patient with a list of pcps who accept Medicaid insurance in [employer].  Patient thankful for resources.  No further EDCM needs ta this time.

## 2014-03-16 NOTE — ED Notes (Signed)
Bed: WA05 Expected date:  Expected time:  Means of arrival:  Comments: EMS-[facility] 

## 2014-03-16 NOTE — ED Notes (Signed)
Pt. Ambulated down the hall and back to her room without difficulty. Pt. Gait steady on her feet. 

## 2014-03-16 NOTE — Discharge Instructions (Signed)

## 2014-03-16 NOTE — ED Notes (Signed)
Per EMS pt developed tingling, numb and weakness in legs

## 2014-03-26 ENCOUNTER — Ambulatory Visit: Payer: BC Managed Care – PPO | Attending: Internal Medicine | Admitting: Internal Medicine

## 2014-03-26 ENCOUNTER — Encounter: Payer: Self-pay | Admitting: Internal Medicine

## 2014-03-26 VITALS — BP 132/86 | HR 77 | Temp 98.7°F | Resp 14 | Ht 63.0 in | Wt 275.0 lb

## 2014-03-26 DIAGNOSIS — Z79899 Other long term (current) drug therapy: Secondary | ICD-10-CM | POA: Insufficient documentation

## 2014-03-26 DIAGNOSIS — R5381 Other malaise: Secondary | ICD-10-CM

## 2014-03-26 DIAGNOSIS — R5383 Other fatigue: Principal | ICD-10-CM

## 2014-03-26 DIAGNOSIS — J45909 Unspecified asthma, uncomplicated: Secondary | ICD-10-CM | POA: Insufficient documentation

## 2014-03-26 LAB — LIPID PANEL
CHOL/HDL RATIO: 5.1 ratio
Cholesterol: 250 mg/dL — ABNORMAL HIGH (ref 0–200)
HDL: 49 mg/dL (ref >39)
LDL Cholesterol: 179 mg/dL — ABNORMAL HIGH (ref 0–99)
TRIGLYCERIDES: 109 mg/dL (ref <150)
VLDL: 22 mg/dL (ref 0–40)

## 2014-03-26 LAB — TSH: TSH: 3.642 u[IU]/mL (ref 0.350–4.500)

## 2014-03-26 LAB — CK: CK TOTAL: 84 U/L (ref 7–177)

## 2014-03-26 NOTE — Progress Notes (Signed)
Pt is here to establish care. Pt states that sometime she get slurred speech with muscle weakness. She states that she has had this for years but it has become more frequent.

## 2014-03-26 NOTE — Patient Instructions (Signed)

## 2014-03-26 NOTE — Progress Notes (Signed)
Patient ID: Carla Lawson, female   DOB: August 03, 1976, 38 y.o.   MRN: [id number]  CC: weakness  HPI: 38 year old female with no significant past medical history other than asthma, well-controlled who presented to clinic for evaluation of persistent fatigue and weakness for past couple of months. Patient has had MRI done which showed possible multiple sclerosis. She's not on any treatment. She has never seen a neurologist.  No Known Allergies Past Medical History  Diagnosis Date  . Asthma   . S/P ablation of ventricular arrhythmia    Current Outpatient Prescriptions on File Prior to Visit  Medication Sig Dispense Refill  . albuterol (PROVENTIL HFA;VENTOLIN HFA) 108 (90 BASE) MCG/ACT inhaler Inhale 2 puffs into the lungs every 6 (six) hours as needed for wheezing or shortness of breath.  1 Inhaler  2   No current facility-administered medications on file prior to visit.   Family History  Problem Relation Age of Onset  . Diabetes Father   . Hypertension Father   . Diabetes Brother   . Heart disease Brother   . Heart disease Maternal Grandmother   . Cancer Paternal Grandmother   . Diabetes Paternal Grandmother    History   Social History  . Marital Status: Single    Spouse Name: N/A    Number of Children: N/A  . Years of Education: N/A   Occupational History  . Not on file.   Social History Main Topics  . Smoking status: Never Smoker   . Smokeless tobacco: Not on file  . Alcohol Use: No  . Drug Use: No  . Sexual Activity: Yes    Birth Control/ Protection: None   Other Topics Concern  . Not on file   Social History Narrative  . No narrative on file    Review of Systems  Constitutional: Negative for fever, chills, diaphoresis, activity change, appetite change and fatigue.  HENT: Negative for ear pain, nosebleeds, congestion, facial swelling, rhinorrhea, neck pain, neck stiffness and ear discharge.   Eyes: Negative for pain, discharge, redness, itching and visual  disturbance.  Respiratory: Negative for cough, choking, chest tightness, shortness of breath, wheezing and stridor.   Cardiovascular: Negative for chest pain, palpitations and leg swelling.  Gastrointestinal: Negative for abdominal distention.  Genitourinary: Negative for dysuria, urgency, frequency, hematuria, flank pain, decreased urine volume, difficulty urinating and dyspareunia.  Musculoskeletal: Negative for back pain, joint swelling, arthralgias and gait problem.  Neurological: per HPI  Hematological: Negative for adenopathy. Does not bruise/bleed easily.  Psychiatric/Behavioral: Negative for hallucinations, behavioral problems, confusion, dysphoric mood, decreased concentration and agitation.    Objective:   Filed Vitals:   03/26/14 1022  BP: 132/86  Pulse: 77  Temp: 98.7 F (37.1 C)  Resp: 14    Physical Exam  Constitutional: Appears well-developed and well-nourished. No distress.  HENT: Normocephalic. External right and left ear normal. Oropharynx is clear and moist.  Eyes: Conjunctivae and EOM are normal. PERRLA, no scleral icterus.  Neck: Normal ROM. Neck supple. No JVD. No tracheal deviation. No thyromegaly.  CVS: RRR, S1/S2 +, no murmurs, no gallops, no carotid bruit.  Pulmonary: Effort and breath sounds normal, no stridor, rhonchi, wheezes, rales.  Abdominal: Soft. BS +,  no distension, tenderness, rebound or guarding.  Musculoskeletal: Normal range of motion. No edema and no tenderness.  Lymphadenopathy: No lymphadenopathy noted, cervical, inguinal. Neuro: Alert. Normal reflexes, muscle tone coordination. No cranial nerve deficit. Skin: Skin is warm and dry. No rash noted. Not diaphoretic. No erythema.  No pallor.  Psychiatric: Normal mood and affect. Behavior, judgment, thought content normal.   Lab Results  Component Value Date   WBC 10.3 03/16/2014   HGB 11.0* 03/16/2014   HCT 34.2* 03/16/2014   MCV 74.2* 03/16/2014   PLT 252 03/16/2014   Lab Results   Component Value Date   CREATININE 0.92 03/16/2014   BUN 11 03/16/2014   NA 138 03/16/2014   K 4.0 03/16/2014   CL 100 03/16/2014   CO2 25 03/16/2014    No results found for this basename: HGBA1C   Lipid Panel  No results found for this basename: chol, trig, hdl, cholhdl, vldl, ldlcalc       Assessment and plan:   Patient Active Problem List   Diagnosis Date Noted  . Weakness - Possible MS based on MRI brain. Referral to neurology provided. Order placed for A1c, TSH, lipid panel, CK 04/03/2009

## 2014-04-03 ENCOUNTER — Telehealth: Payer: Self-pay | Admitting: Emergency Medicine

## 2014-04-03 ENCOUNTER — Telehealth: Payer: Self-pay | Admitting: General Practice

## 2014-04-03 NOTE — Telephone Encounter (Signed)
Spoke with pt in regards to needing medical clearance to return to work. Pt will pick up at front desk 04/04/14. Pt also requesting neurology referral be changed to [facility] instead of [facility]. I will speak with Arna Mediciora regarding change. Pt states her insurance coverage [employer]/shield started 7/09/13/13.

## 2014-04-03 NOTE — Telephone Encounter (Signed)
Pt. Would like a note for work stating patient is cleared to go back to work...Marland KitchenMarland Kitchen

## 2014-04-04 ENCOUNTER — Encounter: Payer: Self-pay | Admitting: Emergency Medicine

## 2014-04-20 ENCOUNTER — Other Ambulatory Visit: Payer: Self-pay | Admitting: Internal Medicine

## 2014-04-20 ENCOUNTER — Telehealth: Payer: Self-pay | Admitting: General Practice

## 2014-04-20 NOTE — Telephone Encounter (Signed)
Called pt and LVM to schedule ED/HFU f/u.

## 2014-04-22 ENCOUNTER — Encounter (HOSPITAL_COMMUNITY): Payer: Self-pay | Admitting: Emergency Medicine

## 2014-04-22 ENCOUNTER — Inpatient Hospital Stay (HOSPITAL_COMMUNITY)
Admission: EM | Admit: 2014-04-22 | Discharge: 2014-04-24 | DRG: 918 | Disposition: A | Discharge status: Other Acute Inpatient Hospital | Payer: BC Managed Care – PPO | Attending: Internal Medicine | Admitting: Internal Medicine

## 2014-04-22 DIAGNOSIS — F332 Major depressive disorder, recurrent severe without psychotic features: Secondary | ICD-10-CM

## 2014-04-22 DIAGNOSIS — F32A Depression, unspecified: Secondary | ICD-10-CM | POA: Diagnosis present

## 2014-04-22 DIAGNOSIS — E663 Overweight: Secondary | ICD-10-CM | POA: Diagnosis present

## 2014-04-22 DIAGNOSIS — X838XXA Intentional self-harm by other specified means, initial encounter: Secondary | ICD-10-CM

## 2014-04-22 DIAGNOSIS — J45909 Unspecified asthma, uncomplicated: Secondary | ICD-10-CM | POA: Diagnosis present

## 2014-04-22 DIAGNOSIS — T6591XA Toxic effect of unspecified substance, accidental (unintentional), initial encounter: Secondary | ICD-10-CM | POA: Diagnosis present

## 2014-04-22 DIAGNOSIS — T65891A Toxic effect of other specified substances, accidental (unintentional), initial encounter: Principal | ICD-10-CM | POA: Diagnosis present

## 2014-04-22 DIAGNOSIS — F411 Generalized anxiety disorder: Secondary | ICD-10-CM | POA: Diagnosis present

## 2014-04-22 DIAGNOSIS — R45851 Suicidal ideations: Secondary | ICD-10-CM

## 2014-04-22 DIAGNOSIS — F329 Major depressive disorder, single episode, unspecified: Secondary | ICD-10-CM

## 2014-04-22 DIAGNOSIS — T6592XA Toxic effect of unspecified substance, intentional self-harm, initial encounter: Secondary | ICD-10-CM | POA: Diagnosis present

## 2014-04-22 DIAGNOSIS — E785 Hyperlipidemia, unspecified: Secondary | ICD-10-CM | POA: Diagnosis present

## 2014-04-22 DIAGNOSIS — K219 Gastro-esophageal reflux disease without esophagitis: Secondary | ICD-10-CM | POA: Diagnosis present

## 2014-04-22 DIAGNOSIS — Z6841 Body Mass Index (BMI) 40.0 and over, adult: Secondary | ICD-10-CM

## 2014-04-22 DIAGNOSIS — E7800 Pure hypercholesterolemia, unspecified: Secondary | ICD-10-CM

## 2014-04-22 DIAGNOSIS — T1491XA Suicide attempt, initial encounter: Secondary | ICD-10-CM | POA: Diagnosis present

## 2014-04-22 DIAGNOSIS — F3289 Other specified depressive episodes: Secondary | ICD-10-CM | POA: Diagnosis present

## 2014-04-22 LAB — COMPREHENSIVE METABOLIC PANEL
ALT: 12 U/L (ref 0–35)
AST: 15 U/L (ref 0–37)
Albumin: 3.5 g/dL (ref 3.5–5.2)
Alkaline Phosphatase: 77 U/L (ref 39–117)
Anion gap: 12 (ref 5–15)
BUN: 11 mg/dL (ref 6–23)
CALCIUM: 9.7 mg/dL (ref 8.4–10.5)
CO2: 24 meq/L (ref 19–32)
CREATININE: 0.97 mg/dL (ref 0.50–1.10)
Chloride: 102 mEq/L (ref 96–112)
GFR calc non Af Amer: 73 mL/min — ABNORMAL LOW (ref >90)
GFR, EST AFRICAN AMERICAN: 84 mL/min — AB (ref >90)
Glucose, Bld: 95 mg/dL (ref 70–99)
Potassium: 4.1 mEq/L (ref 3.7–5.3)
SODIUM: 138 meq/L (ref 137–147)
TOTAL PROTEIN: 7.9 g/dL (ref 6.0–8.3)
Total Bilirubin: 0.4 mg/dL (ref 0.3–1.2)

## 2014-04-22 LAB — CBC
HCT: 35.5 % — ABNORMAL LOW (ref 36.0–46.0)
Hemoglobin: 11.3 g/dL — ABNORMAL LOW (ref 12.0–15.0)
MCH: 24.2 pg — AB (ref 26.0–34.0)
MCHC: 31.8 g/dL (ref 30.0–36.0)
MCV: 76.2 fL — AB (ref 78.0–100.0)
PLATELETS: 247 10*3/uL (ref 150–400)
RBC: 4.66 MIL/uL (ref 3.87–5.11)
RDW: 14.3 % (ref 11.5–15.5)
WBC: 9.7 10*3/uL (ref 4.0–10.5)

## 2014-04-22 LAB — ETHANOL: Alcohol, Ethyl (B): <11 mg/dL (ref 0–11)

## 2014-04-22 LAB — RAPID URINE DRUG SCREEN, HOSP PERFORMED
Amphetamines: NOT DETECTED
BENZODIAZEPINES: NOT DETECTED
Barbiturates: NOT DETECTED
Cocaine: NOT DETECTED
OPIATES: NOT DETECTED
Tetrahydrocannabinol: NOT DETECTED

## 2014-04-22 LAB — PREGNANCY, URINE: PREG TEST UR: NEGATIVE

## 2014-04-22 LAB — ACETAMINOPHEN LEVEL: Acetaminophen (Tylenol), Serum: <15 ug/mL (ref 10–30)

## 2014-04-22 LAB — SALICYLATE LEVEL

## 2014-04-22 MED ORDER — ONDANSETRON HCL 4 MG PO TABS: 4.0000 mg | ORAL_TABLET | Freq: Four times a day (QID) | ORAL | Status: DC | PRN

## 2014-04-22 MED ORDER — ACETAMINOPHEN 650 MG RE SUPP: 650.0000 mg | Freq: Four times a day (QID) | RECTAL | Status: DC | PRN

## 2014-04-22 MED ORDER — SODIUM CHLORIDE 0.9 % IV BOLUS (SEPSIS)
1000.0000 mL | Freq: Once | INTRAVENOUS | Status: AC
  Administered 2014-04-22: 1000 mL via INTRAVENOUS

## 2014-04-22 MED ORDER — KCL IN DEXTROSE-NACL 20-5-0.45 MEQ/L-%-% IV SOLN
INTRAVENOUS | Status: DC
  Administered 2014-04-22 – 2014-04-23 (×2): via INTRAVENOUS
  Filled 2014-04-22 (×2): qty 1000

## 2014-04-22 MED ORDER — ONDANSETRON HCL 4 MG/2ML IJ SOLN: 4.0000 mg | Freq: Four times a day (QID) | INTRAMUSCULAR | Status: DC | PRN

## 2014-04-22 MED ORDER — ACETAMINOPHEN 325 MG PO TABS: 650.0000 mg | ORAL_TABLET | Freq: Four times a day (QID) | ORAL | Status: DC | PRN

## 2014-04-22 MED ORDER — HEPARIN SODIUM (PORCINE) 5000 UNIT/ML IJ SOLN
5000.0000 [IU] | Freq: Three times a day (TID) | INTRAMUSCULAR | Status: DC
  Administered 2014-04-22 – 2014-04-24 (×5): 5000 [IU] via SUBCUTANEOUS
  Filled 2014-04-22 (×8): qty 1

## 2014-04-22 MED ORDER — ALBUTEROL SULFATE (2.5 MG/3ML) 0.083% IN NEBU: 2.5000 mg | INHALATION_SOLUTION | Freq: Four times a day (QID) | RESPIRATORY_TRACT | Status: DC | PRN

## 2014-04-22 MED ORDER — PANTOPRAZOLE SODIUM 40 MG IV SOLR
40.0000 mg | Freq: Once | INTRAVENOUS | Status: DC
  Administered 2014-04-22: 40 mg via INTRAVENOUS
  Filled 2014-04-22: qty 40

## 2014-04-22 MED ORDER — PANTOPRAZOLE SODIUM 40 MG IV SOLR
40.0000 mg | INTRAVENOUS | Status: DC
  Filled 2014-04-22: qty 40

## 2014-04-22 NOTE — ED Notes (Signed)
I have just phoned report to [place], RN--will transport shortly.

## 2014-04-22 NOTE — ED Notes (Addendum)
Pt has been changed into wine colored scrubs and belongings placed in bag. Patient has been wanded by security and belongings have been searched.

## 2014-04-22 NOTE — ED Notes (Signed)
Writer spoke with poison control at 1405, spoke with [place], she reports the coffee cleaner is caustic with ph 13, Recommended, pt remain NPO 1-2 hours, if pt becomes symptomatic or shows signs of symptoms then obtain Chest x-ray and KUB, do not administer charcoal, At present time obtain 12 Ld EKG, 4 hour Tylenol, and scope within 24 hours for esophogeal burns/irritation. They will follow up in a few hours.

## 2014-04-22 NOTE — ED Notes (Signed)
Mother made aware pt is present in our ED, Feliz Beamravis also made aware of pts location.

## 2014-04-22 NOTE — ED Provider Notes (Signed)
Pt presented to the ED after an intentional ingestion of coffee pot cleaner.  PH 13.  Pt denies any symptoms currently.  Denies any other ingestions.  No sore throat.  No drooling.  Physical Exam  BP 140/83  Pulse 71  Temp(Src) 97.9 F (36.6 C) (Oral)  Resp 17  SpO2 100%  LMP 04/15/2014  Physical Exam  Nursing note and vitals reviewed. Constitutional: She appears well-developed and well-nourished. No distress.  HENT:  Head: Normocephalic and atraumatic.  Right Ear: External ear normal.  Left Ear: External ear normal.  Mouth/Throat: No oral lesions. No uvula swelling. No oropharyngeal exudate, posterior oropharyngeal edema or posterior oropharyngeal erythema.  Eyes: Conjunctivae are normal. Right eye exhibits no discharge. Left eye exhibits no discharge. No scleral icterus.  Neck: Neck supple. No tracheal deviation present.  Cardiovascular: Normal rate.   Pulmonary/Chest: Effort normal. No stridor. No respiratory distress.  Musculoskeletal: She exhibits no edema.  Neurological: She is alert. Cranial nerve deficit: no gross deficits.  Skin: Skin is warm and dry. No rash noted.  Psychiatric: She is withdrawn. She exhibits a depressed mood.    ED Course  Procedures  MDM Alkali ingestion.  Currently without signs of mucosal injury.  Poison center consulted.  Admit for monitoring.  GI consultation.      Linwood Dibbles, MD 04/22/14 [phone]

## 2014-04-22 NOTE — ED Notes (Addendum)
Pt arrived via EMS after reporting she ingested about 2-3.5 ML of Silver Coffee Pot cleaner with plan intent to harm self. Pt arrives with very flat affect, will answer some questions but will not volunteer any additional information. Pt denies any breathing difficulties or burning sensation in throat.

## 2014-04-22 NOTE — ED Notes (Signed)
Pt has requested her mother Lurena JoinerRebecca [phone] and friend Feliz Beamravis [phone] be made aware she is here. She gave permission to speak with them regarding her condition.

## 2014-04-22 NOTE — ED Notes (Signed)
Pt. Continues to be in no distress.  She had drank 1/2 glass of water ~ 30 min. Ago without difficulty.  Inp't. Orders rec'd. And now pt. Is NPO.  After much difficulty, we manage to start her IV.

## 2014-04-22 NOTE — ED Notes (Signed)
Two belongings bags placed in locker 27

## 2014-04-22 NOTE — ED Provider Notes (Signed)
CSN: [id number]     Arrival date & time 04/22/14  1401 History   None    Chief Complaint  Patient presents with  . Ingestion    (Consider location/radiation/quality/duration/timing/severity/associated sxs/prior Treatment) HPI Comments: Patient is a 38 year old female history of asthma who presents to the emergency department today after ingesting "a couple sips" of coffee pot cleaner. This has a pH of 13. The patient reports that she has been under an increasing amount of stress. She was at work when she drank the cleaner. She states that she took some sips and asked her coworkers if it would make her "go to sleep". Her coworkers then called EMS. She reports that she was able to take another swig of the cleaner prior to EMS arrival. This was in an attempt to harm herself. She reports she has had prior suicide attempts in the past. She currently denies any pain, shortness of breath, nausea to me. She has a very flat affect and is reluctant to give information. She denies any alcohol or other drug ingestion.  The bottle of Coffee Pot Cleaner was brought with the patient. It is [employer] cleaner.   Patient is a 38 y.o. female presenting with Ingested Medication. The history is provided by the patient. No language interpreter was used.  Ingestion Pertinent negatives include no abdominal pain, chest pain, chills, fever, nausea or vomiting.    Past Medical History  Diagnosis Date  . Asthma   . S/P ablation of ventricular arrhythmia    Past Surgical History  Procedure Laterality Date  . Cardiac electrophysiology mapping and ablation    . Cesarean section     Family History  Problem Relation Age of Onset  . Diabetes Father   . Hypertension Father   . Diabetes Brother   . Heart disease Brother   . Heart disease Maternal Grandmother   . Cancer Paternal Grandmother   . Diabetes Paternal Grandmother    History  Substance Use Topics  . Smoking status: Never Smoker   . Smokeless  tobacco: Not on file  . Alcohol Use: No   OB History   Grav Para Term Preterm Abortions TAB SAB Ect Mult Living                 Review of Systems  Constitutional: Negative for fever and chills.  Respiratory: Negative for shortness of breath.   Cardiovascular: Negative for chest pain.  Gastrointestinal: Negative for nausea, vomiting and abdominal pain.  Psychiatric/Behavioral: Positive for suicidal ideas and dysphoric mood.  All other systems reviewed and are negative.     Allergies  Review of patient's allergies indicates no known allergies.  Home Medications   Prior to Admission medications   Medication Sig Start Date End Date Taking? Authorizing Provider  albuterol (PROVENTIL HFA;VENTOLIN HFA) 108 (90 BASE) MCG/ACT inhaler Inhale 2 puffs into the lungs every 6 (six) hours as needed for wheezing or shortness of breath. 01/11/14  Yes Rodolph BongEvan S Corey, MD   BP 143/77  Pulse 70  Temp(Src) 97.9 F (36.6 C) (Oral)  Resp 16  SpO2 100%  LMP 04/15/2014 Physical Exam  Nursing note and vitals reviewed. Constitutional: She is oriented to person, place, and time. She appears well-developed and well-nourished. No distress.  HENT:  Head: Normocephalic and atraumatic.  Right Ear: External ear normal.  Left Ear: External ear normal.  Nose: Nose normal.  Mouth/Throat: Oropharynx is clear and moist.  Eyes: Conjunctivae are normal.  Neck: Normal range of motion.  Cardiovascular:  Normal rate, regular rhythm and normal heart sounds.   Pulmonary/Chest: Effort normal and breath sounds normal. No stridor. No respiratory distress. She has no wheezes. She has no rales.  Abdominal: Soft. She exhibits no distension.  Musculoskeletal: Normal range of motion.  Neurological: She is alert and oriented to person, place, and time. She has normal strength.  Skin: Skin is warm and dry. She is not diaphoretic. No erythema.  Psychiatric: Her behavior is normal. She exhibits a depressed mood. She expresses  suicidal ideation. She expresses suicidal plans.    ED Course  Procedures (including critical care time) Labs Review Labs Reviewed  CBC - Abnormal; Notable for the following:    Hemoglobin 11.3 (*)    HCT 35.5 (*)    MCV 76.2 (*)    MCH 24.2 (*)    All other components within normal limits  COMPREHENSIVE METABOLIC PANEL - Abnormal; Notable for the following:    GFR calc non Af Amer 73 (*)    GFR calc Af Amer 84 (*)    All other components within normal limits  SALICYLATE LEVEL - Abnormal; Notable for the following:    Salicylate Lvl <2.0 (*)    All other components within normal limits  ACETAMINOPHEN LEVEL  ETHANOL  URINE RAPID DRUG SCREEN (HOSP PERFORMED)  PREGNANCY, URINE  BASIC METABOLIC PANEL  CBC    Imaging Review No results found.   EKG Interpretation   Date/Time:  Sunday April 22 2014 14:29:22 EDT Ventricular Rate:  71 PR Interval:  140 QRS Duration: 91 QT Interval:  413 QTC Calculation: 449 R Axis:   29 Text Interpretation:  Sinus rhythm No significant change since last  tracing Confirmed by KNAPP  MD-J, JON ([id number]) on 04/22/2014 5:00:48 PM      4:33 PM Discussed case with Dr. Juanda Chance of GI who recommends giving dose of protonix and she will scope tomorrow.   MDM   Final diagnoses:  Ingestion of toxin, initial encounter  Asthma, unspecified asthma severity, uncomplicated  Depression  GERD without esophagitis  Suicide attempt   Patient presents to ED after ingesting Coffee Pot Cleaner. PH of 13. Poison control contacted who recommend NPO status and endoscopy in 24 hours to evaluate for asymptomatic esophageal burns. If patient becomes symptomatic, obtain CXR. Patient is currently reporting that she is asymptomatic. No shortness of breath, stridor, chest pain. She is hemodynamically stable. Will admit for further observation. GI consulted and will see patient in the morning. Psychiatry was consulted as this was a suicide attempt. Admission and  consultations appreciated. Dr. Lynelle Doctor evaluated patient and agrees with plan. Patient / Family / Caregiver informed of clinical course, understand medical decision-making process, and agree with plan.  Mora Bellman, PA-C 04/22/14 2021

## 2014-04-22 NOTE — ED Notes (Signed)
She drank coffee pot cleaner in attempt to harm herself.  She has a depressed affect.  She tells me she does not have any pain or discomfort.  She states "My tongue was numb--but it's ok now".  Her skin is normal, warm and dry and she is breathing normally.

## 2014-04-22 NOTE — Progress Notes (Signed)
I have received a request for EGD in this pt who ingested an alkaline  Cleaner but is in no distress now 7 hours following the ingestion. EGD is usually contraindicated in severe esophageal/gastric  burns to avoid perforation. We will reasses in am. Keep NPO. PPI IV.

## 2014-04-22 NOTE — H&P (Signed)
[facility] History and Physical  Carla Lawson ZOX:[id number] DOB: [birth year] DOA: 04/22/2014  Referring physician: Dr. Lynelle DoctorKnapp PCP: No PCP Per Patient   Chief Complaint: Suicidal attempt  HPI: Carla Apleyiffany L Lawson is a 38 y.o. female with past medical history significant for overweight, asthma, hyperlipidemia, gastroesophageal reflux disease and depression; who came to the hospital by EMS after she attempted suicide while at work drinking coffee tea pot cleaner. Patient reports that she needed to harm herself and that she has been feeling severely depressed secondary to social stressors at home. Patient denies any fever, chills, palpitations, chest pain, nausea, vomiting, abdominal pain, burning sensation in her throat or upper chest and also denies dysuria. In the ED workup essentially normal. Poison control has been contacted and given the high caustic effect of this cleaner (pH of 13) has been recommended for the patient to be observed and 24 hours later to perform an EGD to rule out any further damages to her gi tract.  Review of Systems:  Negative except as otherwise mentioned on history of present illness.  Past Medical History  Diagnosis Date  . Asthma   . S/P ablation of ventricular arrhythmia    Past Surgical History  Procedure Laterality Date  . Cardiac electrophysiology mapping and ablation    . Cesarean section     Social History:  reports that she has never smoked. She does not have any smokeless tobacco history on file. She reports that she does not drink alcohol or use illicit drugs.  No Known Allergies  Family History  Problem Relation Age of Onset  . Diabetes Father   . Hypertension Father   . Diabetes Brother   . Heart disease Brother   . Heart disease Maternal Grandmother   . Cancer Paternal Grandmother   . Diabetes Paternal Grandmother      Prior to Admission medications   Medication Sig Start Date End Date Taking? Authorizing Provider  albuterol  (PROVENTIL HFA;VENTOLIN HFA) 108 (90 BASE) MCG/ACT inhaler Inhale 2 puffs into the lungs every 6 (six) hours as needed for wheezing or shortness of breath. 01/11/14  Yes Rodolph BongEvan S Corey, MD   Physical Exam: Filed Vitals:   04/22/14 1405 04/22/14 1410 04/22/14 1555  BP: 143/77  140/83  Pulse: 70  71  Temp: 97.9 F (36.6 C)    TempSrc: Oral    Resp:  16 17  SpO2: 100%  100%    Wt Readings from Last 3 Encounters:  03/26/14 124.739 kg (275 lb)  03/12/14 117.935 kg (260 lb)  02/27/14 117.935 kg (260 lb)    General:  Flat affect, limited conversation; patient denies any pain in her throat chest or abdomen. No nausea or vomiting. No fever Eyes: PERRL, normal lids, irises & conjunctiva, no icterus, no nystagmus ENT: grossly normal hearing, no erythema or exudates inside her mouth; moist mucous membranes; no drainage out of her ears or nostrils Neck: no LAD, masses or thyromegaly; no JVD Cardiovascular: RRR, no m/r/g. No LE edema. Respiratory: CTA bilaterally, no w/r/r. Normal respiratory effort. Abdomen: soft, nontender, nondistended, positive bowel sounds Skin: no rash or induration seen on exam Musculoskeletal: grossly normal tone BUE/BLE Psychiatric: grossly normal mood and affect, speech fluent and appropriate Neurologic: grossly non-focal.          Labs on Admission:  Basic Metabolic Panel:  Recent Labs Lab 04/22/14 1437  NA 138  K 4.1  CL 102  CO2 24  GLUCOSE 95  BUN 11  CREATININE 0.97  CALCIUM 9.7   Liver Function Tests:  Recent Labs Lab 04/22/14 1437  AST 15  ALT 12  ALKPHOS 77  BILITOT 0.4  PROT 7.9  ALBUMIN 3.5   CBC:  Recent Labs Lab 04/22/14 1437  WBC 9.7  HGB 11.3*  HCT 35.5*  MCV 76.2*  PLT 247   Assessment/Plan 1-suicide attempt: Patient has intentionally ingested coffee tea pot cleaner in at 10 and to harm herself.  -For some withdrawal has been consulted and at this moment recommendations are for patient to N.p.o., IV Protonix and  supportive care. -After 24 hours EGD will need to be attempted to rule out any further caustic damages to her esophagus -Patient will be admitted to MedSurg bed with seizure at bedside and suicide precautions -Psychiatry has been consulted to assess patient (she might benefit of inpatient psychiatry treatment).  2-HYPERCHOLESTEROLEMIA: Patient with history of hypercholesterolemia but has not been using any statins as an outpatient. -Will check lipid profile  3-GERD without esophagitis: Continue IV Protonix  4-Asthma: Stable and currently no wheezing or complaining of shortness of breath. -Will continue as needed albuterol  5-Depression: Patient with severe depression and flat affect. Has just intent to commit suicide. -Psychiatry has been consulted -Will most likely require inpatient psychiatry treatment  Psychiatry  GI (Dr. Juanda Chance)  Code Status: Full DVT Prophylaxis:heparin Family Communication: no family at bedside Disposition Plan: MedSurg bed, of sedation, length of stay less than 2 midnights  Time spent: 45 Minutes  Vassie Loll [facility] Pager [phone]  **Disclaimer: This note may have been dictated with voice recognition software. Similar sounding words can inadvertently be transcribed and this note may contain transcription errors which may not have been corrected upon publication of note.**

## 2014-04-23 ENCOUNTER — Encounter (HOSPITAL_COMMUNITY)
Admission: EM | Disposition: A | Discharge status: Other Acute Inpatient Hospital | Payer: Self-pay | Source: Home / Self Care | Attending: Internal Medicine

## 2014-04-23 ENCOUNTER — Encounter (HOSPITAL_COMMUNITY): Payer: Self-pay | Admitting: *Deleted

## 2014-04-23 DIAGNOSIS — T65891A Toxic effect of other specified substances, accidental (unintentional), initial encounter: Secondary | ICD-10-CM

## 2014-04-23 DIAGNOSIS — K219 Gastro-esophageal reflux disease without esophagitis: Secondary | ICD-10-CM | POA: Diagnosis present

## 2014-04-23 DIAGNOSIS — F411 Generalized anxiety disorder: Secondary | ICD-10-CM

## 2014-04-23 DIAGNOSIS — E663 Overweight: Secondary | ICD-10-CM | POA: Diagnosis present

## 2014-04-23 DIAGNOSIS — R45851 Suicidal ideations: Secondary | ICD-10-CM

## 2014-04-23 DIAGNOSIS — T6592XA Toxic effect of unspecified substance, intentional self-harm, initial encounter: Secondary | ICD-10-CM | POA: Diagnosis present

## 2014-04-23 DIAGNOSIS — Z6841 Body Mass Index (BMI) 40.0 and over, adult: Secondary | ICD-10-CM | POA: Diagnosis not present

## 2014-04-23 DIAGNOSIS — F329 Major depressive disorder, single episode, unspecified: Secondary | ICD-10-CM | POA: Diagnosis present

## 2014-04-23 DIAGNOSIS — F332 Major depressive disorder, recurrent severe without psychotic features: Secondary | ICD-10-CM

## 2014-04-23 DIAGNOSIS — E785 Hyperlipidemia, unspecified: Secondary | ICD-10-CM | POA: Diagnosis present

## 2014-04-23 DIAGNOSIS — F3289 Other specified depressive episodes: Secondary | ICD-10-CM | POA: Diagnosis present

## 2014-04-23 DIAGNOSIS — J45909 Unspecified asthma, uncomplicated: Secondary | ICD-10-CM | POA: Diagnosis present

## 2014-04-23 DIAGNOSIS — T6591XA Toxic effect of unspecified substance, accidental (unintentional), initial encounter: Secondary | ICD-10-CM

## 2014-04-23 DIAGNOSIS — E7800 Pure hypercholesterolemia, unspecified: Secondary | ICD-10-CM

## 2014-04-23 HISTORY — PX: ESOPHAGOGASTRODUODENOSCOPY: SHX5428

## 2014-04-23 LAB — BASIC METABOLIC PANEL
Anion gap: 11 (ref 5–15)
BUN: 8 mg/dL (ref 6–23)
CHLORIDE: 104 meq/L (ref 96–112)
CO2: 26 mEq/L (ref 19–32)
Calcium: 9 mg/dL (ref 8.4–10.5)
Creatinine, Ser: 1.03 mg/dL (ref 0.50–1.10)
GFR calc non Af Amer: 68 mL/min — ABNORMAL LOW (ref >90)
GFR, EST AFRICAN AMERICAN: 78 mL/min — AB (ref >90)
Glucose, Bld: 110 mg/dL — ABNORMAL HIGH (ref 70–99)
Potassium: 3.9 mEq/L (ref 3.7–5.3)
Sodium: 141 mEq/L (ref 137–147)

## 2014-04-23 LAB — CBC
HCT: 34.1 % — ABNORMAL LOW (ref 36.0–46.0)
HEMOGLOBIN: 11 g/dL — AB (ref 12.0–15.0)
MCH: 24.2 pg — ABNORMAL LOW (ref 26.0–34.0)
MCHC: 32.3 g/dL (ref 30.0–36.0)
MCV: 74.9 fL — ABNORMAL LOW (ref 78.0–100.0)
PLATELETS: 227 10*3/uL (ref 150–400)
RBC: 4.55 MIL/uL (ref 3.87–5.11)
RDW: 14.3 % (ref 11.5–15.5)
WBC: 9 10*3/uL (ref 4.0–10.5)

## 2014-04-23 SURGERY — EGD (ESOPHAGOGASTRODUODENOSCOPY)
Anesthesia: Moderate Sedation

## 2014-04-23 MED ORDER — ESCITALOPRAM OXALATE 10 MG PO TABS
10.0000 mg | ORAL_TABLET | Freq: Every day | ORAL | Status: DC
  Administered 2014-04-23 – 2014-04-24 (×2): 10 mg via ORAL
  Filled 2014-04-23 (×2): qty 1

## 2014-04-23 MED ORDER — FENTANYL CITRATE 0.05 MG/ML IJ SOLN
INTRAMUSCULAR | Status: DC | PRN
  Administered 2014-04-23 (×2): 25 ug via INTRAVENOUS

## 2014-04-23 MED ORDER — SIMVASTATIN 20 MG PO TABS
20.0000 mg | ORAL_TABLET | Freq: Every day | ORAL | Status: DC
Start: 2014-04-23 — End: 2014-04-24
  Administered 2014-04-23: 20 mg via ORAL
  Filled 2014-04-23 (×2): qty 1

## 2014-04-23 MED ORDER — MIDAZOLAM HCL 10 MG/2ML IJ SOLN
INTRAMUSCULAR | Status: DC | PRN
  Administered 2014-04-23: 1 mg via INTRAVENOUS
  Administered 2014-04-23 (×2): 2 mg via INTRAVENOUS

## 2014-04-23 MED ORDER — PANTOPRAZOLE SODIUM 40 MG PO TBEC
40.0000 mg | DELAYED_RELEASE_TABLET | Freq: Every day | ORAL | Status: DC
  Administered 2014-04-23 – 2014-04-24 (×2): 40 mg via ORAL
  Filled 2014-04-23 (×3): qty 1

## 2014-04-23 MED ORDER — DIPHENHYDRAMINE HCL 50 MG/ML IJ SOLN
INTRAMUSCULAR | Status: AC
  Filled 2014-04-23: qty 1

## 2014-04-23 MED ORDER — MIDAZOLAM HCL 10 MG/2ML IJ SOLN
INTRAMUSCULAR | Status: AC
  Filled 2014-04-23: qty 4

## 2014-04-23 MED ORDER — BUTAMBEN-TETRACAINE-BENZOCAINE 2-2-14 % EX AERO
INHALATION_SPRAY | CUTANEOUS | Status: DC | PRN
  Administered 2014-04-23: 2 via TOPICAL

## 2014-04-23 MED ORDER — SODIUM CHLORIDE 0.9 % IV SOLN: INTRAVENOUS | Status: DC

## 2014-04-23 MED ORDER — FENTANYL CITRATE 0.05 MG/ML IJ SOLN
INTRAMUSCULAR | Status: AC
  Filled 2014-04-23: qty 4

## 2014-04-23 MED ORDER — CLONAZEPAM 0.5 MG PO TABS
0.5000 mg | ORAL_TABLET | Freq: Two times a day (BID) | ORAL | Status: DC
  Administered 2014-04-23 – 2014-04-24 (×3): 0.5 mg via ORAL
  Filled 2014-04-23 (×3): qty 1

## 2014-04-23 NOTE — Progress Notes (Signed)
UR completed 

## 2014-04-23 NOTE — Consult Note (Signed)
Referring Provider: No ref. provider found Primary Care Physician:  No PCP Per Patient Primary Gastroenterologist:  None, unassigned  Reason for Consultation:  Lye ingestion  HPI: Carla Lawson is a 38 y.o. female with past medical history significant for asthma, hyperlipidemia, gastroesophageal reflux disease and depression.  She came to the hospital by EMS after she attempted suicide while at work drinking coffee pot cleaner on 8/16. Patient reported that she needed to harm herself and that she has been feeling severely depressed secondary to social stressors at home. Patient denies any fever, chills, palpitations, chest pain, nausea, vomiting, abdominal pain, burning sensation in her throat or upper chest. In the ED workup essentially normal. Poison control was contacted and given the high caustic effect of this cleaner (pH of 13), it was recommended for the patient to be observed and 24 hours later to perform an EGD to rule out any further damages to her GI tract.  She is on suicide precautions with a sitter.   Past Medical History  Diagnosis Date  . Asthma   . S/P ablation of ventricular arrhythmia     Past Surgical History  Procedure Laterality Date  . Cardiac electrophysiology mapping and ablation    . Cesarean section      Prior to Admission medications   Medication Sig Start Date End Date Taking? Authorizing Provider  albuterol (PROVENTIL HFA;VENTOLIN HFA) 108 (90 BASE) MCG/ACT inhaler Inhale 2 puffs into the lungs every 6 (six) hours as needed for wheezing or shortness of breath. 01/11/14  Yes Rodolph Bong, MD    Current Facility-Administered Medications  Medication Dose Route Frequency Provider Last Rate Last Dose  . acetaminophen (TYLENOL) tablet 650 mg  650 mg Oral Q6H PRN Vassie Loll, MD       Or  . acetaminophen (TYLENOL) suppository 650 mg  650 mg Rectal Q6H PRN Vassie Loll, MD      . albuterol (PROVENTIL) (2.5 MG/3ML) 0.083% nebulizer solution 2.5 mg  2.5 mg  Nebulization Q6H PRN Vassie Loll, MD      . dextrose 5 % and 0.45 % NaCl with KCl 20 mEq/L infusion   Intravenous Continuous Vassie Loll, MD 75 mL/hr at 04/22/14 2025    . heparin injection 5,000 Units  5,000 Units Subcutaneous 3 times per day Vassie Loll, MD   5,000 Units at 04/23/14 [phone]  . ondansetron (ZOFRAN) tablet 4 mg  4 mg Oral Q6H PRN Vassie Loll, MD       Or  . ondansetron [facility]) injection 4 mg  4 mg Intravenous Q6H PRN Vassie Loll, MD      . pantoprazole (PROTONIX) injection 40 mg  40 mg Intravenous Q24H Vassie Loll, MD        Allergies as of 04/22/2014  . (No Known Allergies)    Family History  Problem Relation Age of Onset  . Diabetes Father   . Hypertension Father   . Diabetes Brother   . Heart disease Brother   . Heart disease Maternal Grandmother   . Cancer Paternal Grandmother   . Diabetes Paternal Grandmother     History   Social History  . Marital Status: Single    Spouse Name: N/A    Number of Children: N/A  . Years of Education: N/A   Occupational History  . Not on file.   Social History Main Topics  . Smoking status: Never Smoker   . Smokeless tobacco: Not on file  . Alcohol Use: No  . Drug Use:  No  . Sexual Activity: Yes    Birth Control/ Protection: None   Other Topics Concern  . Not on file   Social History Narrative  . No narrative on file    Review of Systems: Ten point ROS is O/W negative except as mentioned in HPI.  Physical Exam: Vital signs in last 24 hours: Temp:  [97.7 F (36.5 C)-98.1 F (36.7 C)] 97.7 F (36.5 C) (08/17 0544) Pulse Rate:  [70-79] 77 (08/17 0544) Resp:  [16-20] 18 (08/17 0544) BP: (106-143)/(72-83) 106/72 mmHg (08/17 0544) SpO2:  [100 %] 100 % (08/17 0544) Weight:  [278 lb 7.1 oz (126.3 kg)] 278 lb 7.1 oz (126.3 kg) (08/16 1900) Last BM Date: 04/22/14 General:  Alert, Well-developed, well-nourished, in NAD Head:  Normocephalic and atraumatic. Eyes:  Sclera clear, no icterus.    Conjunctiva pink. Ears:  Normal auditory acuity. Mouth:  No deformity or lesions.   Lungs:  Clear throughout to auscultation.  No wheezes, crackles, or rhonchi.  Heart:  Regular rate and rhythm; no murmurs, clicks, rubs, or gallops. Abdomen:  Soft, non-distended.  BS present.  Non-tender.   Rectal:  Deferred  Msk:  Symmetrical without gross deformities. Pulses:  Normal pulses noted. Extremities:  Without clubbing or edema. Neurologic:  Alert and  oriented x4;  grossly normal neurologically. Skin:  Intact without significant lesions or rashes. Psych:  Alert and cooperative. Normal mood and affect.  Lab Results:  Recent Labs  04/22/14 1437 04/23/14 0455  WBC 9.7 9.0  HGB 11.3* 11.0*  HCT 35.5* 34.1*  PLT 247 227   BMET  Recent Labs  04/22/14 1437 04/23/14 0455  NA 138 141  K 4.1 3.9  CL 102 104  CO2 24 26  GLUCOSE 95 110*  BUN 11 8  CREATININE 0.97 1.03  CALCIUM 9.7 9.0   LFT  Recent Labs  04/22/14 1437  PROT 7.9  ALBUMIN 3.5  AST 15  ALT 12  ALKPHOS 77  BILITOT 0.4   IMPRESSION:  -Caustic lye ingestion:  Patient looks well and is asymptomatic.   -Depression with suicide attempt.  PLAN: -EGD later this AM.   ZEHR, JESSICA D.  04/23/2014, 8:02 AM  Pager number [phone]      Attending physician's note   I have taken a history, examined the patient and reviewed the chart. I agree with the Advanced Practitioner's note, impression and recommendations. EGD this morning to assess for possible esophageal injury.   Meryl DareMalcolm T Stark, MD Clementeen GrahamFACG
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
## 2014-04-23 NOTE — H&P (View-Only) (Signed)
Referring Provider: No ref. provider found Primary Care Physician:  No PCP Per Patient Primary Gastroenterologist:  None, unassigned  Reason for Consultation:  Lye ingestion  HPI: Carla Lawson is a 38 y.o. female with past medical history significant for asthma, hyperlipidemia, gastroesophageal reflux disease and depression.  She came to the hospital by EMS after she attempted suicide while at work drinking coffee pot cleaner on 8/16. Patient reported that she needed to harm herself and that she has been feeling severely depressed secondary to social stressors at home. Patient denies any fever, chills, palpitations, chest pain, nausea, vomiting, abdominal pain, burning sensation in her throat or upper chest. In the ED workup essentially normal. Poison control was contacted and given the high caustic effect of this cleaner (pH of 13), it was recommended for the patient to be observed and 24 hours later to perform an EGD to rule out any further damages to her GI tract.  She is on suicide precautions with a sitter.   Past Medical History  Diagnosis Date  . Asthma   . S/P ablation of ventricular arrhythmia     Past Surgical History  Procedure Laterality Date  . Cardiac electrophysiology mapping and ablation    . Cesarean section      Prior to Admission medications   Medication Sig Start Date End Date Taking? Authorizing Provider  albuterol (PROVENTIL HFA;VENTOLIN HFA) 108 (90 BASE) MCG/ACT inhaler Inhale 2 puffs into the lungs every 6 (six) hours as needed for wheezing or shortness of breath. 01/11/14  Yes Rodolph Bong, MD    Current Facility-Administered Medications  Medication Dose Route Frequency Provider Last Rate Last Dose  . acetaminophen (TYLENOL) tablet 650 mg  650 mg Oral Q6H PRN Vassie Loll, MD       Or  . acetaminophen (TYLENOL) suppository 650 mg  650 mg Rectal Q6H PRN Vassie Loll, MD      . albuterol (PROVENTIL) (2.5 MG/3ML) 0.083% nebulizer solution 2.5 mg  2.5 mg  Nebulization Q6H PRN Vassie Loll, MD      . dextrose 5 % and 0.45 % NaCl with KCl 20 mEq/L infusion   Intravenous Continuous Vassie Loll, MD 75 mL/hr at 04/22/14 2025    . heparin injection 5,000 Units  5,000 Units Subcutaneous 3 times per day Vassie Loll, MD   5,000 Units at 04/23/14 [phone]  . ondansetron (ZOFRAN) tablet 4 mg  4 mg Oral Q6H PRN Vassie Loll, MD       Or  . ondansetron [facility]) injection 4 mg  4 mg Intravenous Q6H PRN Vassie Loll, MD      . pantoprazole (PROTONIX) injection 40 mg  40 mg Intravenous Q24H Vassie Loll, MD        Allergies as of 04/22/2014  . (No Known Allergies)    Family History  Problem Relation Age of Onset  . Diabetes Father   . Hypertension Father   . Diabetes Brother   . Heart disease Brother   . Heart disease Maternal Grandmother   . Cancer Paternal Grandmother   . Diabetes Paternal Grandmother     History   Social History  . Marital Status: Single    Spouse Name: N/A    Number of Children: N/A  . Years of Education: N/A   Occupational History  . Not on file.   Social History Main Topics  . Smoking status: Never Smoker   . Smokeless tobacco: Not on file  . Alcohol Use: No  . Drug Use:  No  . Sexual Activity: Yes    Birth Control/ Protection: None   Other Topics Concern  . Not on file   Social History Narrative  . No narrative on file    Review of Systems: Ten point ROS is O/W negative except as mentioned in HPI.  Physical Exam: Vital signs in last 24 hours: Temp:  [97.7 F (36.5 C)-98.1 F (36.7 C)] 97.7 F (36.5 C) (08/17 0544) Pulse Rate:  [70-79] 77 (08/17 0544) Resp:  [16-20] 18 (08/17 0544) BP: (106-143)/(72-83) 106/72 mmHg (08/17 0544) SpO2:  [100 %] 100 % (08/17 0544) Weight:  [278 lb 7.1 oz (126.3 kg)] 278 lb 7.1 oz (126.3 kg) (08/16 1900) Last BM Date: 04/22/14 General:  Alert, Well-developed, well-nourished, in NAD Head:  Normocephalic and atraumatic. Eyes:  Sclera clear, no icterus.    Conjunctiva pink. Ears:  Normal auditory acuity. Mouth:  No deformity or lesions.   Lungs:  Clear throughout to auscultation.  No wheezes, crackles, or rhonchi.  Heart:  Regular rate and rhythm; no murmurs, clicks, rubs, or gallops. Abdomen:  Soft, non-distended.  BS present.  Non-tender.   Rectal:  Deferred  Msk:  Symmetrical without gross deformities. Pulses:  Normal pulses noted. Extremities:  Without clubbing or edema. Neurologic:  Alert and  oriented x4;  grossly normal neurologically. Skin:  Intact without significant lesions or rashes. Psych:  Alert and cooperative. Normal mood and affect.  Lab Results:  Recent Labs  04/22/14 1437 04/23/14 0455  WBC 9.7 9.0  HGB 11.3* 11.0*  HCT 35.5* 34.1*  PLT 247 227   BMET  Recent Labs  04/22/14 1437 04/23/14 0455  NA 138 141  K 4.1 3.9  CL 102 104  CO2 24 26  GLUCOSE 95 110*  BUN 11 8  CREATININE 0.97 1.03  CALCIUM 9.7 9.0   LFT  Recent Labs  04/22/14 1437  PROT 7.9  ALBUMIN 3.5  AST 15  ALT 12  ALKPHOS 77  BILITOT 0.4   IMPRESSION:  -Caustic lye ingestion:  Patient looks well and is asymptomatic.   -Depression with suicide attempt.  PLAN: -EGD later this AM.   ZEHR, JESSICA D.  04/23/2014, 8:02 AM  Pager number [phone]      Attending physician's note   I have taken a history, examined the patient and reviewed the chart. I agree with the Advanced Practitioner's note, impression and recommendations. EGD this morning to assess for possible esophageal injury.   Demarrion Meiklejohn T Lance Huar[place], MD FACG 

## 2014-04-23 NOTE — Interval H&P Note (Signed)
History and Physical Interval Note:  04/23/2014 10:02 AM  Carla Lawson  has presented today for surgery, with the diagnosis of lye ingestion  The various methods of treatment have been discussed with the patient and family. After consideration of risks, benefits and other options for treatment, the patient has consented to  Procedure(s): ESOPHAGOGASTRODUODENOSCOPY (EGD) (N/A) as a surgical intervention .  The patient's history has been reviewed, patient examined, no change in status, stable for surgery.  I have reviewed the patient's chart and labs.  Questions were answered to the patient's satisfaction.     Venita LickMalcolm T. Russella DarStark MD

## 2014-04-23 NOTE — Progress Notes (Signed)
Clinical Social Work  Per MD, patient is medically stable for DC. CSW contacted the following hospitals:  [facility]- available beds. Referral faxed.  [facility]) who is aware of needs and will call CSW if bed is available.  [facility]- 1 bed available. Referral faxed.  [facility]- available beds. Referral faxed.  [facility]- available beds. Referral faxed.  CSW will continue to follow.  [place]olly Mekiah [place], [place] [phone]
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
## 2014-04-23 NOTE — Op Note (Signed)
[facility] [address] [place], [address]   ENDOSCOPY PROCEDURE REPORT  PATIENT: Carla, Lawson  MR#: [id number] BIRTHDATE: 1976/01/14 , 38  yrs. old GENDER: Female ENDOSCOPIST: Meryl Dare, MD, [facility] REFERRED BY:  [facility] PROCEDURE DATE:  04/23/2014 PROCEDURE:  EGD, diagnostic ASA CLASS:     Class II INDICATIONS:  Caustic ingestion. MEDICATIONS: These medications were titrated to patient response per physician's verbal order, Fentanyl 50 mcg IV, and Versed 5 mg IV TOPICAL ANESTHETIC: Cetacaine Spray DESCRIPTION OF PROCEDURE: After the risks benefits and alternatives of the procedure were thoroughly explained, informed consent was obtained.  The Pentax Gastroscope [id number] endoscope was introduced through the mouth and advanced to the second portion of the duodenum. Without limitations.  The instrument was slowly withdrawn as the mucosa was fully examined.    ESOPHAGUS: The mucosa of the esophagus appeared normal.  No signs of a caustic injury were noted. STOMACH: The mucosa and folds of the stomach appeared normal. DUODENUM: The duodenal mucosa showed no abnormalities in the bulb and second portion of the duodenum.  Retroflexed views revealed no abnormalities.  The scope was then withdrawn from the patient and the procedure completed.  COMPLICATIONS: There were no complications.  ENDOSCOPIC IMPRESSION: 1.   The EGD appeared normal  RECOMMENDATIONS: 1.  No esophageal injury noted on EGD. Advance diet as tolerated. Return to care of hospitalists. No GI follow up needed.   eSigned:  Meryl Dare, MD, [facility] 04/23/2014 10:17 AM

## 2014-04-23 NOTE — Progress Notes (Signed)
[facility] PROGRESS NOTE  Carla Lawson DJM:[id number] DOB: 05/09/1976 DOA: 04/22/2014 PCP: No PCP Per Patient  Assessment/Plan: 1-suicide attempt: Patient has intentionally ingested coffee tea pot cleaner in attempt to harm herself.  -EGD normal and no problems or complications appreciated from ingestion of caustic substance -Psych recommending inpatient treatment -continue sitter -start lexapro for depression -start klonopin for anxiety   2-HYPERCHOLESTEROLEMIA: Patient with history of hypercholesterolemia but has not been using any statins as an outpatient.  -lipid profile showing LDL 179 -will start treatment with pravachol 40mg  daily  3-GERD without esophagitis: Continue Protonix   4-Asthma: Stable and currently no wheezing or complaining of shortness of breath.  -Will continue as needed albuterol   5-Depression: Patient with severe depression and flat affect. Has just intent to commit suicide.  -Psychiatry recommending inpatient treatment -started on lexapro as mentioned above for treatment of depression   Code Status: Full Family Communication: mother at bedside Disposition Plan: will required inpatient psychiatry treatment   Consultants:  Psych  GI  Procedures:  EGD (8/17): normal results.  Antibiotics:  None   HPI/Subjective: S/p EGD (no complications and normal results); patient denies CP, SOB, nausea, vomiting or any other complaints. Afebrile. Still overwhelmed and with suicide thoughts.   Objective: Filed Vitals:   04/23/14 1336  BP: 106/72  Pulse: 72  Temp: 97.2 F (36.2 C)  Resp: 18    Intake/Output Summary (Last 24 hours) at 04/23/14 1640 Last data filed at 04/23/14 1601  Gross per 24 hour  Intake    360 ml  Output      0 ml  Net    360 ml   Filed Weights   04/22/14 1900  Weight: 126.3 kg (278 lb 7.1 oz)    Exam:   General:  NAD, afebrile; reports she is hungry  Cardiovascular: S1 and S2, no rubs or  gallops  Respiratory: CTA bilaterally  Abdomen: soft, NT, ND, positive BS  Musculoskeletal: no edema, cyanosis or clubbing   Data Reviewed: Basic Metabolic Panel:  Recent Labs Lab 04/22/14 1437 04/23/14 0455  NA 138 141  K 4.1 3.9  CL 102 104  CO2 24 26  GLUCOSE 95 110*  BUN 11 8  CREATININE 0.97 1.03  CALCIUM 9.7 9.0   Liver Function Tests:  Recent Labs Lab 04/22/14 1437  AST 15  ALT 12  ALKPHOS 77  BILITOT 0.4  PROT 7.9  ALBUMIN 3.5   CBC:  Recent Labs Lab 04/22/14 1437 04/23/14 0455  WBC 9.7 9.0  HGB 11.3* 11.0*  HCT 35.5* 34.1*  MCV 76.2* 74.9*  PLT 247 227    Studies: No results found.  Scheduled Meds: . clonazePAM  0.5 mg Oral BID  . escitalopram  10 mg Oral Daily  . heparin  5,000 Units Subcutaneous 3 times per day  . pantoprazole  40 mg Oral Q0600   Continuous Infusions: . sodium chloride      Principal Problem:   Ingestion of toxin Active Problems:   HYPERCHOLESTEROLEMIA   GERD without esophagitis   Suicide attempt   Asthma   Depression   Suicidal intent    Time spent: <30 minutes    Vassie Loll  [facility] Pager [phone]. If 7PM-7AM, please contact night-coverage at [email or web address], password [facility] 04/23/2014, 4:40 PM  LOS: 1 day

## 2014-04-24 ENCOUNTER — Other Ambulatory Visit: Payer: BC Managed Care – PPO

## 2014-04-24 ENCOUNTER — Encounter (HOSPITAL_COMMUNITY): Payer: Self-pay | Admitting: Gastroenterology

## 2014-04-24 MED ORDER — CLONAZEPAM 0.5 MG PO TABS: 0.5000 mg | ORAL_TABLET | Freq: Two times a day (BID) | ORAL | Status: DC

## 2014-04-24 MED ORDER — ESCITALOPRAM OXALATE 10 MG PO TABS: 10.0000 mg | ORAL_TABLET | Freq: Every day | ORAL | Status: DC

## 2014-04-24 MED ORDER — PRAVASTATIN SODIUM 40 MG PO TABS: 40.0000 mg | ORAL_TABLET | Freq: Every day | ORAL | Status: DC

## 2014-04-24 MED ORDER — PANTOPRAZOLE SODIUM 40 MG PO TBEC: 40.0000 mg | DELAYED_RELEASE_TABLET | Freq: Every day | ORAL | Status: DC

## 2014-04-24 NOTE — Progress Notes (Signed)
CSW [facility] with [facility]oina, [facility] that will return call to clarify any potential bed availability.  CSW called [facility] and spoke with Christiane HaJonathan who reports their intake department attempted on 04/23/14 follow up with the patient's nurse Tom who denied them any information.  CSW reassured Christiane HaJonathan that this process will be explained to the current nurse and have them in contact to complete the referral process.  CSW spoke with Onalee Huaavid, RN explained to situation and provided him with the contact information for Christiane HaJonathan to assist with completing the referral process.  Onalee HuaDavid reports that he will call [facility] to complete the referral process.    Onalee HuaDavid called CSW reporting that he did communicate with [facility] intake department.  CSW inquired about the patient's IVC status if any and Onalee HuaDavid will clarify to return my call.     Maryelizabeth Rowanressa Piedad Standiford, MSW, [place], 04/24/2014 Evening Clinical Social Worker [phone]

## 2014-04-24 NOTE — Progress Notes (Signed)
Report called to Alona Bene at [facility] [place] [facility].  Philomena Doheny RN

## 2014-04-24 NOTE — Progress Notes (Signed)
CSW called [facility] for transportation as instructed by Onalee Hua, Charity fundraiser. [facility] confirmed that transportation was requested and will arrive soon.    Maryelizabeth Rowan, MSW, [place], 04/24/2014 Evening Clinical Social Worker [phone]

## 2014-04-24 NOTE — Progress Notes (Signed)
CSW was informed by Christiane Ha with [facility] the patient was accepted to their facility by Dr. Wendall Stade. Nurse to Nurse report # [phone].  Patient will be going to the [facility] for admission.  CSW informed Onalee Hua, RN of the updated information and will work with him on confirmation of transportation to the receiving facility.  CSW provided Onalee Hua, RN with [employer] [phone] when patient is ready for discharge.  CSW informed Dr. Tandy Gaw of the updated discharge disposition.      Maryelizabeth Rowan, MSW, [place], 04/24/2014 Evening Clinical Social Worker [phone]

## 2014-04-24 NOTE — Discharge Summary (Signed)
Physician Discharge Summary  Carla Apleyiffany L Lawson ZOX:[id number] DOB: [birth year] DOA: 04/22/2014  PCP: No PCP Per Patient  Admit date: 04/22/2014 Discharge date: 04/24/2014  Time spent: >30 minutes  Recommendations for Outpatient Follow-up:  LFT's and lipid profile in 4 weeks  Discharge Diagnoses:  Principal Problem:   Ingestion of toxin Active Problems:   HYPERCHOLESTEROLEMIA   GERD without esophagitis   Suicide attempt   Asthma   Depression   Suicidal intent   Discharge Condition: stable and improved. Medically ready to be discharge to inpatient psychiatry facility for further treatment of her depression/anxiety.   Diet recommendation: low fat diet  Filed Weights   04/22/14 1900  Weight: 126.3 kg (278 lb 7.1 oz)    History of present illness:  38 y.o. female with past medical history significant for overweight, asthma, hyperlipidemia, gastroesophageal reflux disease and depression; who came to the hospital by EMS after she attempted suicide while at work drinking coffee tea pot cleaner. Patient reports that she needed to harm herself and that she has been feeling severely depressed secondary to social stressors at home. Patient denies any fever, chills, palpitations, chest pain, nausea, vomiting, abdominal pain, burning sensation in her throat or upper chest and also denies dysuria. In the ED workup essentially normal. Poison control has been contacted and given the high caustic effect of this cleaner (pH of 13) has been recommended for the patient to be observed and 24 hours later to perform an EGD to rule out any further damages to her gi tract.   Hospital Course:  1-suicide attempt: Patient has intentionally ingested coffee tea pot cleaner in attempt to harm herself.  -EGD normal and no problems or complications appreciated from ingestion of caustic substance  -Psych recommending inpatient treatment  -started lexapro for depression  -started klonopin for anxiety    2-HYPERCHOLESTEROLEMIA: Patient with history of hypercholesterolemia but has not been using any statins as an outpatient.  -lipid profile showing LDL 179  -will start treatment with pravachol 40mg  daily and advise to follow low fat diet  3-GERD without esophagitis: Continue Protonix   4-Asthma: Stable and currently no wheezing or complaining of shortness of breath.  -Will continue as needed albuterol   5-Depression/anxiety: Patient with severe depression and flat affect. With admission secondary to suicide attempt.  -Psychiatry recommending inpatient treatment  -started on lexapro/klonopin as mentioned above for treatment of depression and anxiety    Procedures: EGD 04/23/14 (No esophageal injury noted on EGD)  Consultations:  GI  Psychiatry    Discharge Exam: Filed Vitals:   04/24/14 0630  BP: 118/76  Pulse: 75  Temp: 98 F (36.7 C)  Resp: 20   General: NAD, afebrile; denies hallucinations. Still with very flat affect Cardiovascular: S1 and S2, no rubs or gallops  Respiratory: CTA bilaterally  Abdomen: soft, NT, ND, positive BS  Musculoskeletal: no edema, cyanosis or clubbing    Discharge Instructions You were cared for by a hospitalist during your hospital stay. If you have any questions about your discharge medications or the care you received while you were in the hospital after you are discharged, you can call the unit and asked to speak with the hospitalist on call if the hospitalist that took care of you is not available. Once you are discharged, your primary care physician will handle any further medical issues. Please note that NO REFILLS for any discharge medications will be authorized once you are discharged, as it is imperative that you return to your primary care physician (  or establish a relationship with a primary care physician if you do not have one) for your aftercare needs so that they can reassess your need for medications and monitor your lab  values.  Discharge Instructions   Discharge instructions    Complete by:  As directed   Take medications as prescribed Maintain yourself well hydrated. Follow a low calorie diet and increase exercise            Medication List         albuterol 108 (90 BASE) MCG/ACT inhaler  Commonly known as:  PROVENTIL HFA;VENTOLIN HFA  Inhale 2 puffs into the lungs every 6 (six) hours as needed for wheezing or shortness of breath.     clonazePAM 0.5 MG tablet  Commonly known as:  KLONOPIN  Take 1 tablet (0.5 mg total) by mouth 2 (two) times daily.     escitalopram 10 MG tablet  Commonly known as:  LEXAPRO  Take 1 tablet (10 mg total) by mouth daily.     pantoprazole 40 MG tablet  Commonly known as:  PROTONIX  Take 1 tablet (40 mg total) by mouth daily at 6 (six) AM.     pravastatin 40 MG tablet  Commonly known as:  PRAVACHOL  Take 1 tablet (40 mg total) by mouth daily.       No Known Allergies    The results of significant diagnostics from this hospitalization (including imaging, microbiology, ancillary and laboratory) are listed below for reference.    Labs: Basic Metabolic Panel:  Recent Labs Lab 04/22/14 1437 04/23/14 0455  NA 138 141  K 4.1 3.9  CL 102 104  CO2 24 26  GLUCOSE 95 110*  BUN 11 8  CREATININE 0.97 1.03  CALCIUM 9.7 9.0   Liver Function Tests:  Recent Labs Lab 04/22/14 1437  AST 15  ALT 12  ALKPHOS 77  BILITOT 0.4  PROT 7.9  ALBUMIN 3.5   CBC:  Recent Labs Lab 04/22/14 1437 04/23/14 0455  WBC 9.7 9.0  HGB 11.3* 11.0*  HCT 35.5* 34.1*  MCV 76.2* 74.9*  PLT 247 227   Signed:  Vassie Loll  [facility] 04/24/2014, 11:42 AM

## 2014-05-01 ENCOUNTER — Ambulatory Visit: Payer: BC Managed Care – PPO | Admitting: Neurology

## 2014-05-03 ENCOUNTER — Encounter: Payer: Self-pay | Admitting: *Deleted

## 2014-05-03 ENCOUNTER — Telehealth: Payer: Self-pay | Admitting: Neurology

## 2014-05-03 ENCOUNTER — Ambulatory Visit: Payer: BC Managed Care – PPO | Attending: Internal Medicine | Admitting: *Deleted

## 2014-05-03 DIAGNOSIS — Z595 Extreme poverty: Secondary | ICD-10-CM

## 2014-05-03 DIAGNOSIS — IMO0001 Reserved for inherently not codable concepts without codable children: Secondary | ICD-10-CM

## 2014-05-03 NOTE — Progress Notes (Signed)
No show letter sent for 04-30-2014 

## 2014-05-03 NOTE — Telephone Encounter (Signed)
Pt no showed 04/30/14 appt with Dr. Everlena Cooper. Pt was an ER referral for a brain lesion. Marlane Hatcher to mail pt no show letter / Oneita Kras.

## 2014-05-04 NOTE — Progress Notes (Signed)
LCSW met with patient in order to encourage patient in the treatment process.  Patient identified that after a suicide attempt a few weeks ago she was admitted and discharged from [facility]. Patient stated that she has Intensive Out Patient treatment set up to begin on Sept 3rd, which will allow for her to have support from psychiatry there.  [facility] provides for patient's primary care. Patient has stable housing and family life.  Patient does identify major stressors as her relationship with her mother and her current job.  LCSW will continue to support patient as she navigates through the medical and mental health system. But psychiatric care will be provided through [facility].   Tywan J Lindsey MSW, LCSW Duration 60 minutes  

## 2014-05-07 ENCOUNTER — Ambulatory Visit: Payer: Self-pay | Admitting: Internal Medicine

## 2014-05-10 ENCOUNTER — Encounter (HOSPITAL_COMMUNITY): Payer: Self-pay

## 2014-05-10 ENCOUNTER — Other Ambulatory Visit (HOSPITAL_COMMUNITY): Payer: BC Managed Care – PPO | Attending: Psychiatry | Admitting: Psychiatry

## 2014-05-10 DIAGNOSIS — Z5987 Material hardship due to limited financial resources, not elsewhere classified: Secondary | ICD-10-CM | POA: Insufficient documentation

## 2014-05-10 DIAGNOSIS — F332 Major depressive disorder, recurrent severe without psychotic features: Secondary | ICD-10-CM | POA: Diagnosis present

## 2014-05-10 DIAGNOSIS — G4700 Insomnia, unspecified: Secondary | ICD-10-CM | POA: Diagnosis not present

## 2014-05-10 DIAGNOSIS — J45909 Unspecified asthma, uncomplicated: Secondary | ICD-10-CM | POA: Diagnosis not present

## 2014-05-10 DIAGNOSIS — Z609 Problem related to social environment, unspecified: Secondary | ICD-10-CM | POA: Insufficient documentation

## 2014-05-10 DIAGNOSIS — F410 Panic disorder [episodic paroxysmal anxiety] without agoraphobia: Secondary | ICD-10-CM | POA: Insufficient documentation

## 2014-05-10 DIAGNOSIS — F411 Generalized anxiety disorder: Secondary | ICD-10-CM | POA: Insufficient documentation

## 2014-05-10 DIAGNOSIS — Z598 Other problems related to housing and economic circumstances: Secondary | ICD-10-CM | POA: Diagnosis not present

## 2014-05-10 DIAGNOSIS — F331 Major depressive disorder, recurrent, moderate: Secondary | ICD-10-CM

## 2014-05-10 DIAGNOSIS — R561 Post traumatic seizures: Secondary | ICD-10-CM
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
## 2014-05-10 NOTE — Progress Notes (Signed)
    Daily Group Progress Note  Program: IOP  Group Time: 9:00-10:30  Participation Level: Active  Behavioral Response: Appropriate  Type of Therapy:  Group Therapy  Summary of Progress: Pt. Was with the case manager and psychiatrist.     Group Time: 10:30-12:00  Participation Level:  Active  Behavioral Response: Appropriate  Type of Therapy: Psycho-education Group  Summary of Progress: Pt. Was with the case manager and psychiatrist.  Shaune Pollack, Yong [place]

## 2014-05-11 ENCOUNTER — Encounter (HOSPITAL_COMMUNITY): Payer: Self-pay | Admitting: Psychiatry

## 2014-05-11 ENCOUNTER — Other Ambulatory Visit (HOSPITAL_COMMUNITY): Payer: BC Managed Care – PPO | Admitting: Psychiatry

## 2014-05-11 DIAGNOSIS — F332 Major depressive disorder, recurrent severe without psychotic features: Secondary | ICD-10-CM | POA: Diagnosis not present

## 2014-05-11 DIAGNOSIS — R561 Post traumatic seizures: Secondary | ICD-10-CM

## 2014-05-11 DIAGNOSIS — F331 Major depressive disorder, recurrent, moderate: Secondary | ICD-10-CM
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
## 2014-05-15 ENCOUNTER — Other Ambulatory Visit (HOSPITAL_COMMUNITY): Payer: BC Managed Care – PPO | Admitting: Psychiatry

## 2014-05-15 ENCOUNTER — Encounter (HOSPITAL_COMMUNITY): Payer: Self-pay | Admitting: Psychiatry

## 2014-05-15 DIAGNOSIS — F331 Major depressive disorder, recurrent, moderate: Secondary | ICD-10-CM

## 2014-05-15 DIAGNOSIS — R561 Post traumatic seizures: Secondary | ICD-10-CM

## 2014-05-15 DIAGNOSIS — F332 Major depressive disorder, recurrent severe without psychotic features: Secondary | ICD-10-CM | POA: Diagnosis not present

## 2014-05-15 NOTE — Progress Notes (Signed)
    Daily Group Progress Note  Program: IOP  Group Time: 9:00-10:30  Participation Level: Minimal  Behavioral Response: Appropriate  Type of Therapy:  Group Therapy  Summary of Progress: Pt. Reported that she was doing "ok". Pt. Reported that she spent the weekend at home. Pt. Became tearful when discussing her 39 year old son's observation that she looked lonely. Pt. Reported that she does have some emotional support from family in a cousin but she has not had the energy to initiate conversations with them. Pt. Continues to appear very depressed and angry towards her mother for being excessively dependent on her.     Group Time: 10:30-12:00  Participation Level:  Minimal  Behavioral Response: Appropriate  Type of Therapy: Psycho-education Group  Summary of Progress: Pt. Participated in guided visualization activity.  Shaune Pollack, COUNS

## 2014-05-16 ENCOUNTER — Other Ambulatory Visit (HOSPITAL_COMMUNITY): Payer: BC Managed Care – PPO | Admitting: Psychiatry

## 2014-05-16 ENCOUNTER — Encounter (HOSPITAL_COMMUNITY): Payer: Self-pay | Admitting: Psychiatry

## 2014-05-16 DIAGNOSIS — F332 Major depressive disorder, recurrent severe without psychotic features: Secondary | ICD-10-CM | POA: Diagnosis not present

## 2014-05-16 DIAGNOSIS — F331 Major depressive disorder, recurrent, moderate: Secondary | ICD-10-CM

## 2014-05-16 DIAGNOSIS — R561 Post traumatic seizures: Secondary | ICD-10-CM
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
## 2014-05-16 NOTE — Progress Notes (Signed)
Patient ID: Carla Lawson, female   DOB: 1976/07/26, 38 y.o.   MRN: [id number] Patient seen today. States that she is currently on Lexapro 20 mg and this is helping her tremendously, her anxiety has decreased significantly her sleep is good and she feels that her energy is also gradually improving. Has no crying spells and no anxiety. Concentration is still poor but patient appears calmer. Denies suicidal or homicidal ideation and has no hallucinations or delusions.

## 2014-05-17 ENCOUNTER — Encounter (HOSPITAL_COMMUNITY): Payer: Self-pay | Admitting: Psychiatry

## 2014-05-17 ENCOUNTER — Other Ambulatory Visit (HOSPITAL_COMMUNITY): Payer: BC Managed Care – PPO | Admitting: Psychiatry

## 2014-05-17 DIAGNOSIS — F331 Major depressive disorder, recurrent, moderate: Secondary | ICD-10-CM

## 2014-05-17 DIAGNOSIS — F332 Major depressive disorder, recurrent severe without psychotic features: Secondary | ICD-10-CM | POA: Diagnosis not present

## 2014-05-17 NOTE — Progress Notes (Signed)
    Daily Group Progress Note  Program: IOP  Group Time: 9:00-10:30  Participation Level: Active  Behavioral Response: Appropriate  Type of Therapy:  Group Therapy  Summary of Progress: Pt. Reported that she was doing "good". Pt presented with a bright mood, talked and laughed appropriately. Pt. Discussed her interest in cooking as a leisure activity.     Group Time: 10:30-12:00  Participation Level:  Active  Behavioral Response: Appropriate  Type of Therapy: Psycho-education Group  Summary of Progress: Pt. Participated in discussion about developing self-care activities and learning to think about stress differently. Pt. Watched and discussed [employer] [facility] video.  Shaune Pollack, COUNS
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
## 2014-05-18 ENCOUNTER — Other Ambulatory Visit (HOSPITAL_COMMUNITY): Payer: BC Managed Care – PPO | Admitting: Psychiatry

## 2014-05-18 ENCOUNTER — Encounter (HOSPITAL_COMMUNITY): Payer: Self-pay | Admitting: Psychiatry

## 2014-05-18 DIAGNOSIS — F331 Major depressive disorder, recurrent, moderate: Secondary | ICD-10-CM

## 2014-05-18 DIAGNOSIS — R561 Post traumatic seizures: Secondary | ICD-10-CM

## 2014-05-18 DIAGNOSIS — F332 Major depressive disorder, recurrent severe without psychotic features: Secondary | ICD-10-CM | POA: Diagnosis not present

## 2014-05-18 NOTE — Progress Notes (Signed)
    Daily Group Progress Note  Program: IOP  Group Time: 9:00-10:30  Participation Level: Active  Behavioral Response: Appropriate  Type of Therapy:  Group Therapy  Summary of Progress: Pt. Reported that she was feeling "alright". Pt. Discussed feelings of sadness related to her finances and not completing her college education.     Group Time: 10:30-12:00  Participation Level:  Active  Behavioral Response: Appropriate  Type of Therapy: Psycho-education Group  Summary of Progress: Pt. Participated in guided visualization activity.  Shaune Pollack, COUNS

## 2014-05-21 ENCOUNTER — Encounter (HOSPITAL_COMMUNITY): Payer: Self-pay | Admitting: Psychiatry

## 2014-05-21 ENCOUNTER — Other Ambulatory Visit (HOSPITAL_COMMUNITY): Payer: BC Managed Care – PPO | Admitting: Psychiatry

## 2014-05-21 DIAGNOSIS — F331 Major depressive disorder, recurrent, moderate: Secondary | ICD-10-CM

## 2014-05-21 DIAGNOSIS — F332 Major depressive disorder, recurrent severe without psychotic features: Secondary | ICD-10-CM | POA: Diagnosis not present

## 2014-05-21 NOTE — Progress Notes (Signed)
    Daily Group Progress Note  Program: IOP  Group Time: 9:00-10:30  Participation Level: Active  Behavioral Response: Appropriate  Type of Therapy:  Group Therapy  Summary of Progress: Pt. Reported that she was tired and that she is able to go to sleep but she wakes up frequently throughout the night. Pt.'s thoughts about her financial problems continue to be a trigger for her depression.     Group Time: 10:30-12:00  Participation Level:  Active  Behavioral Response: Appropriate  Type of Therapy: Psycho-education Group  Summary of Progress: Pt. Participated in grief and loss facilitated by Theda Belfast.  Shaune Pollack, COUNS

## 2014-05-22 ENCOUNTER — Other Ambulatory Visit (HOSPITAL_COMMUNITY): Payer: BC Managed Care – PPO | Admitting: Psychiatry

## 2014-05-22 DIAGNOSIS — F332 Major depressive disorder, recurrent severe without psychotic features: Secondary | ICD-10-CM | POA: Diagnosis not present

## 2014-05-22 DIAGNOSIS — F331 Major depressive disorder, recurrent, moderate: Secondary | ICD-10-CM

## 2014-05-23 ENCOUNTER — Encounter (HOSPITAL_COMMUNITY): Payer: Self-pay | Admitting: Psychiatry

## 2014-05-23 ENCOUNTER — Other Ambulatory Visit (HOSPITAL_COMMUNITY): Payer: BC Managed Care – PPO | Admitting: Psychiatry

## 2014-05-23 DIAGNOSIS — F332 Major depressive disorder, recurrent severe without psychotic features: Secondary | ICD-10-CM | POA: Diagnosis not present

## 2014-05-23 NOTE — Progress Notes (Signed)
    Daily Group Progress Note  Program: IOP  Group Time: 9:00-10:30  Participation Level: Active  Behavioral Response: Appropriate  Type of Therapy:  Group Therapy  Summary of Progress: Pt. Reported feeling tired, extreme weakness and not feeling good. Pt attributed today's condition to possible neurological problems and having concerns about MS diagnosis.     Group Time: 10:30-12:00  Participation Level:  Active  Behavioral Response: Appropriate  Type of Therapy: Psycho-education Group  Summary of Progress: Pt. Participated in discussion and instruction about yoga grounding series.  Shaune Pollack, COUNS

## 2014-05-24 ENCOUNTER — Other Ambulatory Visit (HOSPITAL_COMMUNITY): Payer: BC Managed Care – PPO | Admitting: Psychiatry

## 2014-05-24 ENCOUNTER — Encounter (HOSPITAL_COMMUNITY): Payer: Self-pay | Admitting: Psychiatry

## 2014-05-24 DIAGNOSIS — R561 Post traumatic seizures: Secondary | ICD-10-CM

## 2014-05-24 DIAGNOSIS — F332 Major depressive disorder, recurrent severe without psychotic features: Secondary | ICD-10-CM | POA: Diagnosis not present

## 2014-05-24 DIAGNOSIS — F331 Major depressive disorder, recurrent, moderate: Secondary | ICD-10-CM

## 2014-05-24 NOTE — Progress Notes (Signed)
    Daily Group Progress Note  Program: IOP  Group Time: 9:00-10:30  Participation Level: Active  Behavioral Response: Appropriate  Type of Therapy:  Group Therapy  Summary of Progress: Pt. Reported that she was feeling good. Pt. Reported that she practiced the yoga sequence with her son last night and that the poses helped to calm her. Pt. Discussed relationship with her great grandmother who was an inspiration for aging well.      Group Time: 10:30-12:00  Participation Level:  Active  Behavioral Response: Appropriate  Type of Therapy: Psycho-education Group  Summary of Progress: Pt. Was alert and attentive during group facilitated by the mental health association.   Shaune Pollack, COUNS

## 2014-05-24 NOTE — Progress Notes (Signed)
    Daily Group Progress Note  Program: IOP  Group Time: 9:00-10:30  Participation Level: Active  Behavioral Response: Appropriate  Type of Therapy:  Group Therapy  Summary of Progress: Pt. Reported that she was feeling "ok" and was feeling desire to bake a cake. Pt. Discussed plans for business and possibility of returning to school to study interior design.      Group Time: 10:30-12:00  Participation Level:  Active  Behavioral Response: Appropriate  Type of Therapy: Psycho-education Group  Summary of Progress: Pt. Participated in yoga grounding sequence.   Shaune Pollack, COUNS

## 2014-05-25 ENCOUNTER — Other Ambulatory Visit (HOSPITAL_COMMUNITY): Payer: BC Managed Care – PPO | Admitting: Psychiatry

## 2014-05-25 DIAGNOSIS — F332 Major depressive disorder, recurrent severe without psychotic features: Secondary | ICD-10-CM | POA: Diagnosis not present

## 2014-05-25 DIAGNOSIS — F331 Major depressive disorder, recurrent, moderate: Secondary | ICD-10-CM

## 2014-05-25 DIAGNOSIS — R561 Post traumatic seizures: Secondary | ICD-10-CM

## 2014-05-25 MED ORDER — ESCITALOPRAM OXALATE 20 MG PO TABS: 20.0000 mg | ORAL_TABLET | Freq: Every day | ORAL | Status: DC

## 2014-05-25 NOTE — Progress Notes (Signed)
    Daily Group Progress Note  Program: IOP  Group Time: 9:00-10:30  Participation Level: Active  Behavioral Response: Appropriate  Type of Therapy:  Group Therapy  Summary of Progress: Pt. Reported that she was "not good" today because she found out that her fiance was having an affair. Pt. Reported that she has not decided how to resolve the problems in her relationship. Pt. Reported that she has decided that it is time for her mother to get a job and move into her own place and she is committed to developing a definite plan for her mother to move out of their home.      Group Time: 10:30-12:00  Participation Level:  Active  Behavioral Response: Appropriate  Type of Therapy: Psycho-education Group  Summary of Progress: Pt. Participated in discussion about developing healthy relationship boundaries.   Shaune Pollack, COUNS

## 2014-05-25 NOTE — Progress Notes (Signed)
Patient ID: Carla Lawson, female   DOB: September 06, 1976, 38 y.o.   MRN: [id number] Patient seen along with case manager Jeri Modena today has been very angry and so this was processed with her. Patient states that she is upset because her fianc is seeing another woman, she had a talk with her fianc and he states that because of her mother they have no privacy, . and so patient has decided to talk to her mother and give her a month's time to get out of the house. Patient states that she has finally decided to do it. Sleep and appetite are good, mood is anxious denies suicidal or homicidal ideation no hallucinations or delusions tolerating her medications

## 2014-05-28 ENCOUNTER — Other Ambulatory Visit (HOSPITAL_COMMUNITY): Payer: BC Managed Care – PPO | Admitting: Psychiatry

## 2014-05-28 DIAGNOSIS — F332 Major depressive disorder, recurrent severe without psychotic features: Secondary | ICD-10-CM | POA: Diagnosis not present

## 2014-05-28 NOTE — Progress Notes (Signed)
    Daily Group Progress Note  Program: IOP  Group Time: 0900-1030  Participation Level: Active  Behavioral Response: Appropriate, Sharing and Motivated  Type of Therapy:  Group Therapy  Summary of Progress: Patient voiced that she had a productive weekend.  States she was able to set a boundary with her mother.  "I told my mother that she has to move out."  According to pt, she gave her mother a timeline.  Mother was angry, which pt expected her to be.  "She said she would move in with my sister or move back to her home town."  Pt states she feels good with asserting herself.  "I had lost myself, but it's time to start back putting myself first."       Group Time: 1045-1200  Participation Level:  Active  Behavioral Response: Appropriate, Sharing and Motivated  Type of Therapy: Psycho-education Group  Summary of Progress: Discharge Planning: Focused on setting treatment goals for MH-IOP, assessing current resources; while exploring new resources, making a plan for keeping yourself safe while in IOP/Discharge and arranging an individual aftercare plan.

## 2014-05-29 ENCOUNTER — Telehealth (HOSPITAL_COMMUNITY): Payer: Self-pay | Admitting: Psychiatry

## 2014-05-29 ENCOUNTER — Other Ambulatory Visit (HOSPITAL_COMMUNITY): Payer: BC Managed Care – PPO | Admitting: Psychiatry

## 2014-05-29 DIAGNOSIS — F331 Major depressive disorder, recurrent, moderate: Secondary | ICD-10-CM

## 2014-05-29 DIAGNOSIS — F332 Major depressive disorder, recurrent severe without psychotic features: Secondary | ICD-10-CM | POA: Diagnosis not present
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
## 2014-05-29 NOTE — Telephone Encounter (Signed)
Placed call to Loletta Parish [phone]), to request extension from work.  Spoke to [place].  Informed her that patient is currently in MH-IOP and f/u appt is with Dr. Michae Kava on 06-21-14.  Recommend that pt return to work, without any restrictions on 06-25-14.  According to Trula Ore, pt had already been written out of work until 06-17-14 and she would extend the date until 06-24-14.  She states that Dr. Michae Kava would need to fax her progress note, after she see pt on the 15th.  A:  Will inform pt.

## 2014-05-29 NOTE — Patient Instructions (Signed)
Patient completed MH-IOP today.  Will follow up with Boneta Lucks, LPC on 05-30-14 @ 1:30 pm and Dr. Michae Kava on 06-21-14 at 9:30 a.m.  Encouraged support groups.  Return to work on 06-25-14, without any restrictions.

## 2014-05-29 NOTE — Progress Notes (Signed)
    Daily Group Progress Note  Program: IOP  Group Time: 9:00-10:30  Participation Level: Active  Behavioral Response: Appropriate  Type of Therapy:  Group Therapy  Summary of Progress: Pt. Prepared for discharge from the program. Pt. Reported that she is being more assertive in her relationship with her mother and preparing to return to work with better coping skills.     Group Time: 10:30-12:00  Participation Level:  Active  Behavioral Response: Appropriate  Type of Therapy: Psycho-education Group  Summary of Progress: Pt. Participated in discussion and reflective reading about developing an attitude of acceptance about anxiety.  Shaune Pollack, COUNS
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
## 2014-05-30 ENCOUNTER — Ambulatory Visit (INDEPENDENT_AMBULATORY_CARE_PROVIDER_SITE_OTHER): Payer: BC Managed Care – PPO | Admitting: Psychiatry

## 2014-05-30 ENCOUNTER — Other Ambulatory Visit (HOSPITAL_COMMUNITY): Payer: BC Managed Care – PPO

## 2014-05-30 DIAGNOSIS — F331 Major depressive disorder, recurrent, moderate: Secondary | ICD-10-CM

## 2014-05-31 ENCOUNTER — Other Ambulatory Visit (HOSPITAL_COMMUNITY): Payer: BC Managed Care – PPO

## 2014-05-31 NOTE — Progress Notes (Signed)
   THERAPIST PROGRESS NOTE  Session Time: 1:30-2:30  Participation Level: Active  Behavioral Response: CasualAlertEuthymic  Type of Therapy: Individual Therapy  Treatment Goals addressed: Coping, self-esteem  Interventions: CBT and Strength-based  Summary: Carla Lawson is a 38 y.o. female who presents with depression.   Suicidal/Homicidal: Nowithout intent/plan  Therapist Response: This session is patient's first session since discharging from IOP group. Pt. Reports that she is coping better since beginning the IOP program. Pt reports that she has been successful setting healthier boundaries with her mother and that her mother is actively moving out of her home. Pt. Reports that she is hurt by her boyfriend's infidelity but is taking responsibility for how she contributed the current state of their relationship but not his cheating. Pt. Reports that she is seeking support from her father and his family and a cousin who lives out of state. Pt. Reports that she is also actively pursuing her dream of returning to school to complete her degree in accounting and business administration. Session focused on clients goals of completing her education, discontinuing pattern of people pleasing and learning "what Suda wants".  Plan: Return again in 3-4 weeks.  Diagnosis: Axis I: Depressive Disorder NOS    Axis II: No diagnosis    Carla Lawson 05/31/2014

## 2014-06-01 ENCOUNTER — Other Ambulatory Visit (HOSPITAL_COMMUNITY): Payer: BC Managed Care – PPO

## 2014-06-04 ENCOUNTER — Other Ambulatory Visit (HOSPITAL_COMMUNITY): Payer: BC Managed Care – PPO

## 2014-06-05 ENCOUNTER — Other Ambulatory Visit (HOSPITAL_COMMUNITY): Payer: BC Managed Care – PPO

## 2014-06-06 ENCOUNTER — Other Ambulatory Visit (HOSPITAL_COMMUNITY): Payer: BC Managed Care – PPO

## 2014-06-07 ENCOUNTER — Other Ambulatory Visit (HOSPITAL_COMMUNITY): Payer: BC Managed Care – PPO

## 2014-06-08 ENCOUNTER — Other Ambulatory Visit (HOSPITAL_COMMUNITY): Payer: BC Managed Care – PPO

## 2014-06-11 ENCOUNTER — Other Ambulatory Visit (HOSPITAL_COMMUNITY): Payer: BC Managed Care – PPO

## 2014-06-12 ENCOUNTER — Other Ambulatory Visit (HOSPITAL_COMMUNITY): Payer: BC Managed Care – PPO

## 2014-06-13 ENCOUNTER — Other Ambulatory Visit (HOSPITAL_COMMUNITY): Payer: BC Managed Care – PPO

## 2014-06-14 ENCOUNTER — Other Ambulatory Visit (HOSPITAL_COMMUNITY): Payer: BC Managed Care – PPO

## 2014-06-15 ENCOUNTER — Other Ambulatory Visit (HOSPITAL_COMMUNITY): Payer: BC Managed Care – PPO

## 2014-06-18 ENCOUNTER — Other Ambulatory Visit (HOSPITAL_COMMUNITY): Payer: BC Managed Care – PPO

## 2014-06-19 ENCOUNTER — Other Ambulatory Visit (HOSPITAL_COMMUNITY): Payer: BC Managed Care – PPO

## 2014-06-20 ENCOUNTER — Other Ambulatory Visit (HOSPITAL_COMMUNITY): Payer: BC Managed Care – PPO

## 2014-06-21 ENCOUNTER — Ambulatory Visit (INDEPENDENT_AMBULATORY_CARE_PROVIDER_SITE_OTHER): Payer: BC Managed Care – PPO | Admitting: Psychiatry

## 2014-06-21 ENCOUNTER — Encounter (HOSPITAL_COMMUNITY): Payer: Self-pay | Admitting: Psychiatry

## 2014-06-21 VITALS — BP 128/88 | HR 72 | Ht 63.0 in | Wt 275.0 lb

## 2014-06-21 DIAGNOSIS — F332 Major depressive disorder, recurrent severe without psychotic features: Secondary | ICD-10-CM | POA: Insufficient documentation

## 2014-06-21 DIAGNOSIS — F411 Generalized anxiety disorder: Secondary | ICD-10-CM

## 2014-06-21 MED ORDER — ESCITALOPRAM OXALATE 20 MG PO TABS: 20.0000 mg | ORAL_TABLET | Freq: Every day | ORAL | Status: DC
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
## 2014-07-02 ENCOUNTER — Ambulatory Visit (INDEPENDENT_AMBULATORY_CARE_PROVIDER_SITE_OTHER): Payer: BC Managed Care – PPO | Admitting: Neurology

## 2014-07-02 ENCOUNTER — Encounter: Payer: Self-pay | Admitting: Neurology

## 2014-07-02 VITALS — BP 138/84 | HR 80 | Resp 20 | Ht 63.0 in | Wt 276.6 lb

## 2014-07-02 DIAGNOSIS — R531 Weakness: Secondary | ICD-10-CM

## 2014-07-02 DIAGNOSIS — R5383 Other fatigue: Secondary | ICD-10-CM

## 2014-07-02 DIAGNOSIS — G44219 Episodic tension-type headache, not intractable: Secondary | ICD-10-CM

## 2014-07-02 DIAGNOSIS — R930 Abnormal findings on diagnostic imaging of skull and head, not elsewhere classified: Secondary | ICD-10-CM

## 2014-07-02 DIAGNOSIS — R55 Syncope and collapse: Secondary | ICD-10-CM

## 2014-07-02 NOTE — Progress Notes (Addendum)
NEUROLOGY CONSULTATION NOTE  Alveria Apleyiffany L Lofton MRN: [id number] DOB: 07-Feb-1976  Referring provider: Azalia BilisKevin Campos, MD (ED) Primary care provider: Doris Cheadleeepak Advani, MD  Reason for consult:  Abnormal MRI.  HISTORY OF PRESENT ILLNESS: Carla Gipiffany Lawson is a 38 year old right-handed woman with past medical history of asthma, depression, hyperlipidemia, GERD, and status post ablation of ventricular tachycardia who presents for evaluation of multiple sclerosis.  Records and images personally reviewed.  Since about 2008, she has experienced intermittent episodes of generalized weakness and slowed gait.  This started several months after ablation for ventricular tachycardia.  She would develop gradual onset of fatigue, followed by dizziness.  Her muscles would "freeze up" and she would start moving slowly, first in the upper body and then progressing to the lower extremities.  This would cause difficulty ambulating.  She would have slurred speech as well.  It would also be accompanied by short term memory problems, such as misplacing objects.  They usually last 6 to 7 hours, but have lasted up to 2 days.  Frequency varies.  She can go 6-7 months without event.  She has had 3 episodes of syncope with such events.  She has history of positive Tilt table test, of unknown etiology.  Cardiac workup, including heart monitor, was negative.  She also reports history of left sided headaches, pressure-like and non-throbbing.  They are not associated with other symptoms.  They last 6 hours.  She would usually take ibuprofen.  They occur about twice a month.  She has seen a neurologist in the past, with negative workup, including reported unremarkable MRI of the brain.  She presented to the ED on 02/27/14 for such an episode.  An MRI of the brain without contrast was performed, which revealed nonspecific small foci of T2 hyperintensity in the subcortical and deep cerebral white matter bilaterally, sparing the corpus callosum  and periventricular region.  No infratentiorial lesions were seen.  She denies family history of neurological disease.  She since had several more urgent care visits for these symptoms, as well as for shortness of breath. Work up had been unremarkable.  [facility]/22/14 for attempted suicide by drinking tea pot cleaner.  She reportedly had been experiencing severe depression due to social stressors at home.  She was admitted for observation and psychiatric treatment.  EGD was normal.  She was started on Lexapro, which she feels is helping.  TSH from 03/26/14 was 3.642.  PAST MEDICAL HISTORY: Past Medical History  Diagnosis Date  . Asthma   . S/P ablation of ventricular arrhythmia   . Anxiety   . Depression   . Brain lesion     PAST SURGICAL HISTORY: Past Surgical History  Procedure Laterality Date  . Cardiac electrophysiology mapping and ablation    . Cesarean section    . Esophagogastroduodenoscopy N/A 04/23/2014    Procedure: ESOPHAGOGASTRODUODENOSCOPY (EGD);  Surgeon: Meryl DareMalcolm T Stark, MD;  [facility];  Service: Endoscopy;  Laterality: N/A;    MEDICATIONS: Current Outpatient Prescriptions on File Prior to Visit  Medication Sig Dispense Refill  . albuterol (PROVENTIL HFA;VENTOLIN HFA) 108 (90 BASE) MCG/ACT inhaler Inhale 2 puffs into the lungs every 6 (six) hours as needed for wheezing or shortness of breath.  1 Inhaler  2  . escitalopram (LEXAPRO) 20 MG tablet Take 1 tablet (20 mg total) by mouth daily.  30 tablet  1   No current facility-administered medications on file prior to visit.  ALLERGIES: No Known Allergies  FAMILY HISTORY: Family History  Problem Relation Age of Onset  . Diabetes Father   . Hypertension Father   . Diabetes Brother   . Heart disease Brother   . Drug abuse Brother   . Depression Brother   . Suicidality Brother   . Heart disease Maternal Grandmother   . Cancer Paternal Grandmother   . Diabetes Paternal  Grandmother     SOCIAL HISTORY: History   Social History  . Marital Status: Single    Spouse Name: N/A    Number of Children: N/A  . Years of Education: N/A   Occupational History  . Not on file.   Social History Main Topics  . Smoking status: Never Smoker   . Smokeless tobacco: Never Used  . Alcohol Use: Yes     Comment: occasionally- a few times a year  . Drug Use: No  . Sexual Activity: Yes    Partners: Male    Birth Control/ Protection: None   Other Topics Concern  . Not on file   Social History Narrative  . No narrative on file    REVIEW OF SYSTEMS: Constitutional: Fatigue.  No fevers, chills, or sweats, change in appetite Eyes: No visual changes, double vision, eye pain Ear, nose and throat: No hearing loss, ear pain, nasal congestion, sore throat Cardiovascular: No chest pain, palpitations Respiratory:  No shortness of breath at rest or with exertion, wheezes GastrointestinaI: No nausea, vomiting, diarrhea, abdominal pain, fecal incontinence Genitourinary:  No dysuria, urinary retention or frequency Musculoskeletal:  No neck pain, back pain Integumentary: No rash, pruritus, skin lesions Neurological: as above Psychiatric: Depression Endocrine: No palpitations, diaphoresis, mood swings, change in appetite, change in weight, increased thirst Hematologic/Lymphatic:  No anemia, purpura, petechiae. Allergic/Immunologic: no itchy/runny eyes, nasal congestion, recent allergic reactions, rashes  PHYSICAL EXAM: Filed Vitals:   07/02/14 1359  BP: 138/84  Pulse: 80  Resp: 20   General: No acute distress Head:  Normocephalic/atraumatic Neck: supple, no paraspinal tenderness, full range of motion Back: No paraspinal tenderness Heart: regular rate and rhythm Lungs: Clear to auscultation bilaterally. Vascular: No carotid bruits. Neurological Exam: Mental status: alert and oriented to person, place, and time, recent and remote memory intact, fund of knowledge  intact, attention and concentration intact, speech fluent and not dysarthric, language intact. Cranial nerves: CN I: not tested CN II: pupils equal, round and reactive to light, visual fields intact, fundi unremarkable, without vessel changes, exudates, hemorrhages or papilledema. CN III, IV, VI:  full range of motion, no nystagmus, no ptosis CN V: facial sensation intact CN VII: upper and lower face symmetric CN VIII: hearing intact CN IX, X: gag intact, uvula midline CN XI: sternocleidomastoid and trapezius muscles intact CN XII: tongue midline Bulk & Tone: normal, no fasciculations. Motor: 5/5 throughout Sensation: pinprick and vibration intact Deep Tendon Reflexes: 2+ in upper extremities, 3+ in lower extremities and symmetric.  Toes downgoing. Finger to nose testing: no dysmetria Heel to shin: no dysmetria Gait: Unremarkable with mildly short stride.  No ataxia.  Able to turn in 3 steps.  Some difficulty with tandem walking. Romberg negative.  IMPRESSION: 1.  Mildly abnormal MRI due to few non-specific punctate hyperintensities in the subcortical and deep cerebral white matter, of unknown clinical significance.  It is not a classic pattern for MS. 2.  Recurrent transient episodes of fatigue, weakness and slurred speech.  Vague symptomatology.  Unclear etiology 3.  Tension-type headaches 4.  Syncope. 5.  Fatigue  PLAN: 1.  Will get MRI of the cervical and thoracic spines with and without contrast to look for cord signal abnormalities.  If abnormal, would proceed with LP. 2.  If unremarkable, would repeat MRI of the brain with and without contrast in June 2016 to evaluate for any changes. 3.  Check B12 and D 4.  Follow up in 6 months after repeat brain MRI, or sooner pending results of MRI.  Thank you for allowing me to take part in the care of this patient.  Shon Millet, DO  CC:  Doris Cheadle, MD

## 2014-07-02 NOTE — Patient Instructions (Addendum)
I am not sure of the significance of the MRI results.  They are nonspecific and not suggestive of any particular disease.  We will check an MRI of the cervical and thoracic spines with and without contrast.  If they are abnormal, then the next step would be a spinal tap.  If they are normal, then I would simply repeat MRI of the brain with and without contrast in 6 months with follow up soon after.  We will also check vitamin B12 and D levels [facility]  [facility] 07/09/14 630pm

## 2014-07-03 ENCOUNTER — Telehealth: Payer: Self-pay | Admitting: *Deleted

## 2014-07-03 LAB — VITAMIN B12: VITAMIN B 12: 460 pg/mL (ref 211–911)

## 2014-07-03 LAB — VITAMIN D 25 HYDROXY (VIT D DEFICIENCY, FRACTURES): Vit D, 25-Hydroxy: 21 ng/mL — ABNORMAL LOW (ref 30–89)

## 2014-07-03 NOTE — Telephone Encounter (Signed)
Patient is aware of lab results and advise to take Vit D3 800 to 1000 units

## 2014-07-03 NOTE — Telephone Encounter (Signed)
Message copied by Fredirick Maudlin on Tue Jul 03, 2014  8:16 AM ------      Message from: JAFFE, ADAM R      Created: Tue Jul 03, 2014  6:56 AM       Vitamin D level is low.  Recommend starting 2000 units of D3 daily for supplementation.  B12 level is good.      ----- Message -----         From: [facility] in [facility] Interface         Sent: 07/03/2014   4:47 AM           To: Cira Servant, DO                   ------

## 2014-07-05 ENCOUNTER — Telehealth: Payer: Self-pay | Admitting: Neurology

## 2014-07-05 NOTE — Telephone Encounter (Signed)
Pt called regarding her Vitamin 3D. She has questions on the dosage. C/B [phone]

## 2014-07-05 NOTE — Telephone Encounter (Signed)
Returned call to patient she has a Geophysicist/field seismologist that has not been set up yet  So unable to leave message.

## 2014-07-09 ENCOUNTER — Other Ambulatory Visit: Payer: Self-pay

## 2014-07-13 ENCOUNTER — Ambulatory Visit
Admission: RE | Admit: 2014-07-13 | Discharge: 2014-07-13 | Disposition: A | Discharge status: Home / Self Care | Payer: BC Managed Care – PPO | Source: Ambulatory Visit | Attending: Neurology | Admitting: Neurology

## 2014-07-13 DIAGNOSIS — R930 Abnormal findings on diagnostic imaging of skull and head, not elsewhere classified: Secondary | ICD-10-CM

## 2014-07-13 DIAGNOSIS — R55 Syncope and collapse: Secondary | ICD-10-CM

## 2014-07-13 DIAGNOSIS — R531 Weakness: Secondary | ICD-10-CM

## 2014-07-13 DIAGNOSIS — R5383 Other fatigue: Secondary | ICD-10-CM

## 2014-07-13 MED ORDER — GADOBENATE DIMEGLUMINE 529 MG/ML IV SOLN
20.0000 mL | Freq: Once | INTRAVENOUS | Status: AC | PRN
  Administered 2014-07-13: 20 mL via INTRAVENOUS

## 2014-07-16 ENCOUNTER — Telehealth: Payer: Self-pay | Admitting: *Deleted

## 2014-07-16 ENCOUNTER — Other Ambulatory Visit: Payer: Self-pay | Admitting: *Deleted

## 2014-07-16 DIAGNOSIS — R55 Syncope and collapse: Secondary | ICD-10-CM

## 2014-07-16 DIAGNOSIS — G35 Multiple sclerosis: Secondary | ICD-10-CM

## 2014-07-16 DIAGNOSIS — R930 Abnormal findings on diagnostic imaging of skull and head, not elsewhere classified: Secondary | ICD-10-CM

## 2014-07-16 DIAGNOSIS — R531 Weakness: Secondary | ICD-10-CM

## 2014-07-16 DIAGNOSIS — R5383 Other fatigue: Secondary | ICD-10-CM

## 2014-07-16 NOTE — Telephone Encounter (Signed)
Patient is aware of  MRI results . Mri brain was schedulede for 02/14/15 2:45 [facility] f/u with Dr Everlena Cooper 02/20/15 9:50am

## 2014-08-23 ENCOUNTER — Telehealth: Payer: Self-pay | Admitting: *Deleted

## 2014-08-23 ENCOUNTER — Encounter (HOSPITAL_COMMUNITY): Payer: Self-pay | Admitting: Psychiatry

## 2014-08-23 ENCOUNTER — Ambulatory Visit (INDEPENDENT_AMBULATORY_CARE_PROVIDER_SITE_OTHER): Payer: BC Managed Care – PPO | Admitting: Psychiatry

## 2014-08-23 VITALS — BP 138/87 | HR 83 | Wt 273.4 lb

## 2014-08-23 DIAGNOSIS — F411 Generalized anxiety disorder: Secondary | ICD-10-CM

## 2014-08-23 DIAGNOSIS — F332 Major depressive disorder, recurrent severe without psychotic features: Secondary | ICD-10-CM

## 2014-08-23 MED ORDER — ESCITALOPRAM OXALATE 20 MG PO TABS: 20.0000 mg | ORAL_TABLET | Freq: Every day | ORAL | Status: DC

## 2014-08-23 NOTE — Progress Notes (Signed)
[facility] [id number] Progress Note  Carla Lawson [id number] 38 y.o.  08/23/2014 11:08 AM  Chief Complaint: good  History of Present Illness: Carla Lawson is doing good. Her mother moved out about 2 months ago and Carla Lawson is happy. Her son is doing well and seems be more stable. Mom and Carla Lawson are getting along better now and have regular contact.   Denies depression, isolation, worthlessness, hopelessness, anhedonia, and low motivation. Carla Lawson doesn't dread her job anymore and job performance is good. Her boss is praising her. Sleep is good and she is getting about 9hrs. Appetite, energy and concentration are good.   Denies anxiety, excessive worry and racing thoughts.   Takes Lexapro as prescribed and denies SE.   Suicidal Ideation: No Plan Formed: No Patient has means to carry out plan: No  Homicidal Ideation: No Plan Formed: No Patient has means to carry out plan: No  Review of Systems: Psychiatric: Agitation: No Hallucination: No Depressed Mood: No Insomnia: No Hypersomnia: No Altered Concentration: No Feels Worthless: No Grandiose Ideas: No Belief In Special Powers: No New/Increased Substance Abuse: No Compulsions: No  Neurologic: Headache: No Seizure: No Paresthesias: No   Review of Systems  Constitutional: Negative for fever, chills and malaise/fatigue.  HENT: Negative for congestion, ear pain and sore throat.   Eyes: Negative for blurred vision, double vision and redness.  Respiratory: Negative for cough, sputum production and shortness of breath.   Cardiovascular: Negative for chest pain, palpitations and leg swelling.  Gastrointestinal: Negative for heartburn, nausea, abdominal pain and diarrhea.  Musculoskeletal: Negative for myalgias, back pain, joint pain and neck pain.  Skin: Negative for itching and rash.  Neurological: Negative for dizziness, tingling, seizures and headaches.  Psychiatric/Behavioral: Negative for depression, suicidal ideas, hallucinations  and substance abuse. The patient is not nervous/anxious and does not have insomnia.      Past Medical Family, Social History: lives in [place] with her 11yo son. Working at [employer]. reports that she has never smoked. She has never used smokeless tobacco. She reports that she drinks alcohol. She reports that she does not use illicit drugs.  Family History  Problem Relation Age of Onset  . Diabetes Father   . Hypertension Father   . Diabetes Brother   . Heart disease Brother   . Drug abuse Brother   . Depression Brother   . Suicidality Brother   . Heart disease Maternal Grandmother   . Cancer Paternal Grandmother   . Diabetes Paternal Grandmother    Past Medical History  Diagnosis Date  . Asthma   . S/P ablation of ventricular arrhythmia   . Anxiety   . Depression   . Brain lesion      Outpatient Encounter Prescriptions as of 08/23/2014  Medication Sig  . albuterol (PROVENTIL HFA;VENTOLIN HFA) 108 (90 BASE) MCG/ACT inhaler Inhale 2 puffs into the lungs every 6 (six) hours as needed for wheezing or shortness of breath.  . cholecalciferol (VITAMIN D) 400 UNITS TABS tablet Take 400 Units by mouth.  . escitalopram (LEXAPRO) 20 MG tablet Take 1 tablet (20 mg total) by mouth daily.    Past Psychiatric History/Hospitalization(s): Anxiety: Yes Bipolar Disorder: No Depression: Yes Mania: No Psychosis: No Schizophrenia: No Personality Disorder: No Hospitalization for psychiatric illness: Yes History of Electroconvulsive Shock Therapy: No Prior Suicide Attempts: Yes  Physical Exam: Constitutional:  BP 138/87 mmHg  Pulse 83  Wt 273 lb 6.4 oz (124.013 kg)  General Appearance: alert, oriented, no acute distress  Musculoskeletal:  Strength & Muscle Tone: within normal limits Gait & Station: normal Patient leans: N/A  Mental Status Examination/Evaluation: Objective: Attitude: Calm and cooperative  Appearance: Casual, appears to be stated age  Eye Contact::   Good  Speech:  Clear and Coherent and Normal Rate  Volume:  Normal  Mood:  euthymic  Affect:  Full Range  Thought Process:  Goal Directed, Linear and Logical  Orientation:  Full (Time, Place, and Person)  Thought Content:  Negative  Suicidal Thoughts:  No  Homicidal Thoughts:  No  Judgement:  Good  Insight:  Good  Concentration: good  Memory: Immediate-good Recent-good Remote-good  Recall: fair  Language: fair  Gait and Station: normal  [employer] of Knowledge: average  Psychomotor Activity:  Normal  Akathisia:  No  Handed:  Right  AIMS (if indicated): n/a  Assets:  Manufacturing systems engineerCommunication Skills Desire for Improvement Financial Resources/Insurance Housing Intimacy Leisure Time Physical Health Resilience Social Support Veterinary surgeonTalents/Skills Transportation Vocational/Educational       Medical Decision Making (Choose Three): Established Problem, Stable/Improving (1), Review of Psycho-Social Stressors (1) and Review of Medication Regimen & Side Effects (2)  Assessment: AXIS I MDD- recurrent, severe--improving; GAD  AXIS II Deferred  AXIS III Past Medical History  Diagnosis Date  . Asthma   . S/P ablation of ventricular arrhythmia   . Anxiety   . Depression   . Brain lesion      AXIS IV economic problems, occupational problems, other psychosocial or environmental problems and problems with primary support group  AXIS V 51-60 moderate symptoms   Treatment Plan/Recommendations:  Plan of Care: Medication management with supportive therapy. Risks/benefits and SE of the medication discussed. Carla Lawson verbalized understanding and verbal consent obtained for treatment. Affirm with the patient that the medications are taken as ordered. Patient expressed understanding of how their medications were to be used.  -improvement of symptoms  Confidentiality and exclusions reviewed with Carla Lawson who verbalized understanding.   Laboratory: none today   Psychotherapy: Therapy: brief supportive therapy provided. Discussed psychosocial stressors in detail.    Medications: Continue Lexapro 20mg  po qD for depression and anxiety   Routine PRN Medications: No  Consultations: [facility]our[facility]  Safety Concerns: Carla Lawson denies SI and is at an acute low risk for suicide.Patient told to call clinic if any problems occur. Patient advised to go to ER if they should develop SI/HI, side effects, or if symptoms worsen. Has crisis numbers to call if needed. Carla Lawson verbalized understanding.   Other: F/up in 3 months or sooner if needed     Oletta DarterAGARWAL, Keldrick Pomplun, MD 08/23/2014

## 2014-08-23 NOTE — Telephone Encounter (Signed)
Patient cancelled her follow up appointment will call back after the first of the year to reschedule

## 2014-08-29 ENCOUNTER — Ambulatory Visit: Payer: Self-pay | Admitting: Neurology

## 2014-09-21 ENCOUNTER — Ambulatory Visit (HOSPITAL_COMMUNITY): Payer: Self-pay | Admitting: Psychiatry

## 2014-09-28 ENCOUNTER — Ambulatory Visit: Payer: Self-pay | Admitting: Neurology

## 2014-09-29 ENCOUNTER — Emergency Department (HOSPITAL_COMMUNITY)
Admission: EM | Admit: 2014-09-29 | Discharge: 2014-09-29 | Disposition: A | Discharge status: Home / Self Care | Payer: BLUE CROSS/BLUE SHIELD | Attending: Emergency Medicine | Admitting: Emergency Medicine

## 2014-09-29 ENCOUNTER — Encounter (HOSPITAL_COMMUNITY): Payer: Self-pay

## 2014-09-29 DIAGNOSIS — Z8669 Personal history of other diseases of the nervous system and sense organs: Secondary | ICD-10-CM | POA: Insufficient documentation

## 2014-09-29 DIAGNOSIS — F419 Anxiety disorder, unspecified: Secondary | ICD-10-CM | POA: Diagnosis not present

## 2014-09-29 DIAGNOSIS — I951 Orthostatic hypotension: Secondary | ICD-10-CM | POA: Diagnosis not present

## 2014-09-29 DIAGNOSIS — Z79899 Other long term (current) drug therapy: Secondary | ICD-10-CM | POA: Diagnosis not present

## 2014-09-29 DIAGNOSIS — Z3202 Encounter for pregnancy test, result negative: Secondary | ICD-10-CM | POA: Insufficient documentation

## 2014-09-29 DIAGNOSIS — J45909 Unspecified asthma, uncomplicated: Secondary | ICD-10-CM | POA: Diagnosis not present

## 2014-09-29 DIAGNOSIS — F329 Major depressive disorder, single episode, unspecified: Secondary | ICD-10-CM | POA: Diagnosis not present

## 2014-09-29 DIAGNOSIS — R42 Dizziness and giddiness: Secondary | ICD-10-CM

## 2014-09-29 LAB — I-STAT CHEM 8, ED
BUN: 11 mg/dL (ref 6–23)
Calcium, Ion: 1.25 mmol/L — ABNORMAL HIGH (ref 1.12–1.23)
Chloride: 101 mmol/L (ref 96–112)
Creatinine, Ser: 1 mg/dL (ref 0.50–1.10)
GLUCOSE: 156 mg/dL — AB (ref 70–99)
HEMATOCRIT: 38 % (ref 36.0–46.0)
HEMOGLOBIN: 12.9 g/dL (ref 12.0–15.0)
Potassium: 3.9 mmol/L (ref 3.5–5.1)
Sodium: 140 mmol/L (ref 135–145)
TCO2: 23 mmol/L (ref 0–100)

## 2014-09-29 LAB — URINALYSIS, ROUTINE W REFLEX MICROSCOPIC
BILIRUBIN URINE: NEGATIVE
GLUCOSE, UA: NEGATIVE mg/dL
HGB URINE DIPSTICK: NEGATIVE
Ketones, ur: NEGATIVE mg/dL
Leukocytes, UA: NEGATIVE
NITRITE: NEGATIVE
Protein, ur: NEGATIVE mg/dL
Specific Gravity, Urine: 1.028 (ref 1.005–1.030)
Urobilinogen, UA: 0.2 mg/dL (ref 0.0–1.0)
pH: 5.5 (ref 5.0–8.0)

## 2014-09-29 LAB — POC URINE PREG, ED: PREG TEST UR: NEGATIVE

## 2014-09-29 MED ORDER — MECLIZINE HCL 25 MG PO TABS
25.0000 mg | ORAL_TABLET | Freq: Once | ORAL | Status: AC
  Administered 2014-09-29: 25 mg via ORAL
  Filled 2014-09-29: qty 1

## 2014-09-29 MED ORDER — SODIUM CHLORIDE 0.9 % IV BOLUS (SEPSIS)
1000.0000 mL | Freq: Once | INTRAVENOUS | Status: AC
  Administered 2014-09-29: 1000 mL via INTRAVENOUS

## 2014-09-29 MED ORDER — MECLIZINE HCL 25 MG PO TABS
25.0000 mg | ORAL_TABLET | Freq: Three times a day (TID) | ORAL | Status: AC | PRN
Start: 2014-09-29 — End: ?

## 2014-09-29 NOTE — ED Notes (Signed)
Dr. Schmitt at bedside  

## 2014-09-29 NOTE — ED Notes (Signed)
Per EMS: pt with dizziness and fatigue x 5 days.  Pt diagnosed with brain lesions in July by MRI.  Pt sts she has been having several near syncopal episodes recently.  Was suppose to see neurologist yesterday but appointment got canceled due to weather.

## 2014-09-29 NOTE — ED Provider Notes (Signed)
CSN: [id number]     Arrival date & time 09/29/14  1851 History   First MD Initiated Contact with Patient 09/29/14 1851     Chief Complaint  Patient presents with  . Dizziness     (Consider location/radiation/quality/duration/timing/severity/associated sxs/prior Treatment) Patient is a 39 y.o. female presenting with dizziness. The history is provided by the patient. No language interpreter was used.  Dizziness Quality:  Head spinning Severity:  Moderate Onset quality:  Gradual Duration:  5 days Timing:  Intermittent Progression:  Unchanged Chronicity:  New Context: head movement, physical activity and standing up   Relieved by:  Being still Worsened by:  Turning head Associated symptoms: no blood in stool, no diarrhea, no nausea, no shortness of breath, no syncope, no vomiting and no weakness     Past Medical History  Diagnosis Date  . Asthma   . S/P ablation of ventricular arrhythmia   . Anxiety   . Depression   . Brain lesion    Past Surgical History  Procedure Laterality Date  . Cardiac electrophysiology mapping and ablation    . Cesarean section    . Esophagogastroduodenoscopy N/A 04/23/2014    Procedure: ESOPHAGOGASTRODUODENOSCOPY (EGD);  Surgeon: Meryl Dare, MD;  Location: [facility];  Service: Endoscopy;  Laterality: N/A;   Family History  Problem Relation Age of Onset  . Diabetes Father   . Hypertension Father   . Diabetes Brother   . Heart disease Brother   . Drug abuse Brother   . Depression Brother   . Suicidality Brother   . Heart disease Maternal Grandmother   . Cancer Paternal Grandmother   . Diabetes Paternal Grandmother    History  Substance Use Topics  . Smoking status: Never Smoker   . Smokeless tobacco: Never Used  . Alcohol Use: Yes     Comment: occasionally- a few times a year   OB History    No data available     Review of Systems  Constitutional: Negative for fever and chills.  Respiratory: Negative for shortness of breath.    Cardiovascular: Negative for syncope.  Gastrointestinal: Negative for nausea, vomiting, diarrhea and blood in stool.  Genitourinary: Negative for dysuria, urgency and frequency.  Musculoskeletal: Negative for back pain and neck pain.  Neurological: Positive for dizziness. Negative for weakness and numbness.  All other systems reviewed and are negative.     Allergies  Review of patient's allergies indicates no known allergies.  Home Medications   Prior to Admission medications   Medication Sig Start Date End Date Taking? Authorizing Provider  albuterol (PROVENTIL HFA;VENTOLIN HFA) 108 (90 BASE) MCG/ACT inhaler Inhale 2 puffs into the lungs every 6 (six) hours as needed for wheezing or shortness of breath. 01/11/14  Yes Rodolph Bong, MD  cholecalciferol (VITAMIN D) 400 UNITS TABS tablet Take 400 Units by mouth 2 (two) times a week.    Yes Historical Provider, MD  escitalopram (LEXAPRO) 20 MG tablet Take 1 tablet (20 mg total) by mouth daily. 08/23/14  Yes Oletta Darter, MD  meclizine (ANTIVERT) 25 MG tablet Take 1 tablet (25 mg total) by mouth 3 (three) times daily as needed for dizziness. 09/29/14   Bethann Berkshire, MD   BP 133/69 mmHg  Pulse 87  Temp(Src) 98.8 F (37.1 C) (Oral)  Resp 23  SpO2 100%  LMP 09/15/2014 Physical Exam  Constitutional: She is oriented to person, place, and time. She appears well-developed and well-nourished. No distress.  HENT:  Head: Normocephalic and atraumatic.  Eyes:  Pupils are equal, round, and reactive to light.  Neck: Normal range of motion.  Cardiovascular: Normal rate, regular rhythm, normal heart sounds and intact distal pulses.   Pulmonary/Chest: Effort normal. No respiratory distress. She has no wheezes. She exhibits no tenderness.  Abdominal: Soft. Bowel sounds are normal. She exhibits no distension. There is no tenderness. There is no rebound and no guarding.  Neurological: She is alert and oriented to person, place, and time. She has  normal strength. No cranial nerve deficit or sensory deficit. She exhibits normal muscle tone. Coordination and gait normal.  Strength 5/5 bilateral upper and lower extremities.  Sensation intact x4 extremities.  CN II-XII intact.  No difficulty with finger to nose bilaterally.  Normal heel to shin bilaterally.  No dysdiadochokinesia.  Difficulty with tandem gait, but able to walk 6 tandem steps with no loss of balance. Normal gait with no ataxia.   Skin: Skin is warm and dry.  Nursing note and vitals reviewed.   ED Course  Procedures (including critical care time) Labs Review Labs Reviewed  URINALYSIS, ROUTINE W REFLEX MICROSCOPIC - Abnormal; Notable for the following:    APPearance CLOUDY (*)    All other components within normal limits  I-STAT CHEM 8, ED - Abnormal; Notable for the following:    Glucose, Bld 156 (*)    Calcium, Ion 1.25 (*)    All other components within normal limits  POC URINE PREG, ED    Imaging Review No results found.   EKG Interpretation None      MDM   Final diagnoses:  Orthostatic hypotension  Dizziness    Patient is a 39 year old African-American female with pertinent past medical history of depression, syncope, and new white matter lesions found on an MRI in October who comes to the emergency department today with dizziness for the past 5 days. Patient states this is worse when she turns her head or when she walks. Physical exam as above. No focal neurologic deficits I doubt a CVA. The patient does have some difficulty with tandem gait.  However review of previous neurology notes demonstrates that she has had issues with this on previous neurologic exams. Otherwise she has no neurologic deficits. Patient has follow-up with her neurologist on Thursday to evaluate her further for her white matter lesions. With no change in her neurologic exam based on notes I do not feel that she requires a repeat MRI or CT of her head today. Initial workup included a  POC pregnancy, UA, i-STAT Chem-8, and an EKG.  UA was unremarkable. POC pregnancy was negative. I-STAT Chem-8 with a normal creatinine of 1 and a hemoglobin of 12.9. Patient was treated with meclizine in case there is a peripheral component to her symptoms and was treated with a liter of normal saline. Orthostatic vital signs were obtained which demonstrated positive orthostasis. Patient was reevaluated after receiving her fluids and her medications with complete resolution of her symptoms. I feel the patient is stable for discharge at this time. She was instructed to maintain follow-up with her neurologist and to return to the emergency department with weakness, numbness, or any other concerns. The patient expressed understanding. She was discharged in good condition. Labs were reviewed by myself and considered in medical decision making. Care was discussed with my attending Dr. Romeo Apple.      Bethann Berkshire, MD 09/29/14 0981  Purvis Sheffield, MD 09/29/14 2239

## 2014-09-29 NOTE — Discharge Instructions (Signed)

## 2014-10-04 ENCOUNTER — Ambulatory Visit: Payer: Self-pay | Admitting: Neurology

## 2014-10-11 ENCOUNTER — Ambulatory Visit (HOSPITAL_COMMUNITY): Payer: Self-pay | Admitting: Psychiatry

## 2014-10-21 ENCOUNTER — Encounter (HOSPITAL_COMMUNITY): Payer: Self-pay | Admitting: Emergency Medicine

## 2014-10-21 ENCOUNTER — Emergency Department (HOSPITAL_COMMUNITY): Payer: BLUE CROSS/BLUE SHIELD

## 2014-10-21 ENCOUNTER — Emergency Department (HOSPITAL_COMMUNITY)
Admission: EM | Admit: 2014-10-21 | Discharge: 2014-10-21 | Disposition: A | Discharge status: Home / Self Care | Payer: BLUE CROSS/BLUE SHIELD | Attending: Emergency Medicine | Admitting: Emergency Medicine

## 2014-10-21 DIAGNOSIS — F329 Major depressive disorder, single episode, unspecified: Secondary | ICD-10-CM | POA: Diagnosis not present

## 2014-10-21 DIAGNOSIS — Z79899 Other long term (current) drug therapy: Secondary | ICD-10-CM | POA: Diagnosis not present

## 2014-10-21 DIAGNOSIS — J40 Bronchitis, not specified as acute or chronic: Secondary | ICD-10-CM

## 2014-10-21 DIAGNOSIS — J45901 Unspecified asthma with (acute) exacerbation: Secondary | ICD-10-CM | POA: Diagnosis not present

## 2014-10-21 DIAGNOSIS — Z8669 Personal history of other diseases of the nervous system and sense organs: Secondary | ICD-10-CM | POA: Diagnosis not present

## 2014-10-21 DIAGNOSIS — F419 Anxiety disorder, unspecified: Secondary | ICD-10-CM | POA: Insufficient documentation

## 2014-10-21 DIAGNOSIS — J45909 Unspecified asthma, uncomplicated: Secondary | ICD-10-CM | POA: Diagnosis present

## 2014-10-21 MED ORDER — PREDNISONE 20 MG PO TABS: 40.0000 mg | ORAL_TABLET | Freq: Every day | ORAL | Status: DC

## 2014-10-21 MED ORDER — ALBUTEROL SULFATE HFA 108 (90 BASE) MCG/ACT IN AERS: 1.0000 | INHALATION_SPRAY | Freq: Four times a day (QID) | RESPIRATORY_TRACT | Status: DC | PRN

## 2014-10-21 NOTE — ED Provider Notes (Signed)
CSN: [id number]     Arrival date & time 10/21/14  1354 History  This chart was scribed for non-physician practitioner, Teressa Lower, FNP,working with Glynn Octave, MD, by Karle Plumber, ED Scribe. This patient was seen in room WTR7/WTR7 and the patient's care was started at 1:59 PM.  Chief Complaint  Patient presents with  . Asthma   HPI  HPI Comments:  Carla Lawson is a 39 y.o. female brought in by EMS, who presents to the Emergency Department complaining of an asthma attack that started PTA while she was at work. Pt states she has an MDI but the one she had with her was out. She reports associated wheezing. Pt states she last had Prednisone last year. She was given a nebulizer treatment en route via EMS with resolution of her symptoms. She denies any fever, chills, rhinorrhea or sore throat. PMHx of anxiety, depression and brain lesion.  Past Medical History  Diagnosis Date  . Asthma   . S/P ablation of ventricular arrhythmia   . Anxiety   . Depression   . Brain lesion    Past Surgical History  Procedure Laterality Date  . Cardiac electrophysiology mapping and ablation    . Cesarean section    . Esophagogastroduodenoscopy N/A 04/23/2014    Procedure: ESOPHAGOGASTRODUODENOSCOPY (EGD);  Surgeon: Meryl Dare, MD;  Location: [facility];  Service: Endoscopy;  Laterality: N/A;   Family History  Problem Relation Age of Onset  . Diabetes Father   . Hypertension Father   . Diabetes Brother   . Heart disease Brother   . Drug abuse Brother   . Depression Brother   . Suicidality Brother   . Heart disease Maternal Grandmother   . Cancer Paternal Grandmother   . Diabetes Paternal Grandmother    History  Substance Use Topics  . Smoking status: Never Smoker   . Smokeless tobacco: Never Used  . Alcohol Use: Yes     Comment: occasionally- a few times a year   OB History    No data available     Review of Systems  Constitutional: Negative for fever and chills.   HENT: Negative for rhinorrhea and sore throat.   Respiratory: Positive for wheezing.   All other systems reviewed and are negative.   Allergies  Review of patient's allergies indicates no known allergies.  Home Medications   Prior to Admission medications   Medication Sig Start Date End Date Taking? Authorizing Provider  albuterol (PROVENTIL HFA;VENTOLIN HFA) 108 (90 BASE) MCG/ACT inhaler Inhale 2 puffs into the lungs every 6 (six) hours as needed for wheezing or shortness of breath. 01/11/14   Rodolph Bong, MD  cholecalciferol (VITAMIN D) 400 UNITS TABS tablet Take 400 Units by mouth 2 (two) times a week.     Historical Provider, MD  escitalopram (LEXAPRO) 20 MG tablet Take 1 tablet (20 mg total) by mouth daily. 08/23/14   Oletta Darter, MD  meclizine (ANTIVERT) 25 MG tablet Take 1 tablet (25 mg total) by mouth 3 (three) times daily as needed for dizziness. 09/29/14   Bethann Berkshire, MD   Triage Vitals: BP 138/86 mmHg  Pulse 93  Temp(Src) 98.4 F (36.9 C) (Oral)  Resp 18  SpO2 100%  LMP 09/15/2014 Physical Exam  Constitutional: She is oriented to person, place, and time. She appears well-developed and well-nourished.  HENT:  Head: Normocephalic and atraumatic.  Right Ear: External ear normal.  Left Ear: External ear normal.  Mouth/Throat: Oropharynx is clear and moist.  Eyes: Conjunctivae and EOM are normal. Pupils are equal, round, and reactive to light.  Neck: Normal range of motion.  Cardiovascular: Normal rate.   Pulmonary/Chest: Effort normal. She has no wheezes.  Musculoskeletal: Normal range of motion.  Neurological: She is alert and oriented to person, place, and time.  Skin: Skin is warm and dry.  Psychiatric: She has a normal mood and affect. Her behavior is normal.  Nursing note and vitals reviewed.   ED Course  Procedures (including critical care time) DIAGNOSTIC STUDIES: Oxygen Saturation is 100% on RA, normal by my interpretation.   COORDINATION OF  CARE: 2:00 PM- Will order CXR. Pt verbalizes understanding and agrees to plan.  Medications - No data to display  Labs Review Labs Reviewed - No data to display  Imaging Review Dg Chest 2 View  10/21/2014   CLINICAL DATA:  Acute chest pain, cough and shortness of breath. Initial encounter.  EXAM: CHEST  2 VIEW  COMPARISON:  02/26/2009  FINDINGS: The cardiomediastinal silhouette is unremarkable.  Mild peribronchial thickening is unchanged.  There is no evidence of focal airspace disease, pulmonary edema, suspicious pulmonary nodule/mass, pleural effusion, or pneumothorax. No acute bony abnormalities are identified.  IMPRESSION: No evidence of acute cardiopulmonary disease.  Mild chronic peribronchial thickening.   Electronically Signed   By: Harmon Pier M.D.   On: 10/21/2014 14:18     EKG Interpretation None      MDM   Final diagnoses:  Bronchitis    Pt in no distress. Will refill inhaler and start on prednisone. Vital stable  I personally performed the services described in this documentation, which was scribed in my presence. The recorded information has been reviewed and is accurate.    Teressa Lower, NP 10/21/14 1550  Glynn Octave, MD 10/21/14 [phone]

## 2014-10-21 NOTE — ED Notes (Signed)
Per EMS-started coughing at work and then having asthmatic symptoms- 5 mg of albuterol given in route-improved from treatment-here for chest xray to r/o bronchitis

## 2014-10-21 NOTE — Discharge Instructions (Signed)

## 2014-10-29 ENCOUNTER — Ambulatory Visit: Payer: Self-pay | Admitting: Neurology

## 2014-10-29 ENCOUNTER — Telehealth: Payer: Self-pay | Admitting: Neurology

## 2014-10-29 NOTE — Telephone Encounter (Signed)
Pt called returning your call # [phone]

## 2014-10-29 NOTE — Telephone Encounter (Signed)
FMLA papers were sent to [facility] Records for  Them to gather all the records her company requested the hard copy will be scanned into the EPIC chart  Later she should then be able to get a hard copy.I left her a voicemail X 2

## 2014-10-29 NOTE — Telephone Encounter (Signed)
Pt called stating that she is returning your call. C/b [phone]

## 2014-10-29 NOTE — Telephone Encounter (Signed)
Pt states that she dropped off the FMLA paper work last week and we are to fax the forms to the place on the forms but she would like a call so she can get a hard copy for herself please call patient [phone]

## 2014-10-30 NOTE — Telephone Encounter (Signed)
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

## 2014-11-07 ENCOUNTER — Ambulatory Visit: Payer: Self-pay | Admitting: Neurology

## 2014-11-08 ENCOUNTER — Ambulatory Visit (HOSPITAL_COMMUNITY): Payer: Self-pay | Admitting: Psychiatry

## 2014-11-09 ENCOUNTER — Emergency Department (HOSPITAL_COMMUNITY)
Admission: EM | Admit: 2014-11-09 | Discharge: 2014-11-09 | Disposition: A | Discharge status: Home / Self Care | Payer: BLUE CROSS/BLUE SHIELD | Attending: Emergency Medicine | Admitting: Emergency Medicine

## 2014-11-09 ENCOUNTER — Encounter (HOSPITAL_COMMUNITY): Payer: Self-pay | Admitting: Emergency Medicine

## 2014-11-09 DIAGNOSIS — J45909 Unspecified asthma, uncomplicated: Secondary | ICD-10-CM | POA: Diagnosis not present

## 2014-11-09 DIAGNOSIS — Z7952 Long term (current) use of systemic steroids: Secondary | ICD-10-CM | POA: Diagnosis not present

## 2014-11-09 DIAGNOSIS — Z9989 Dependence on other enabling machines and devices: Secondary | ICD-10-CM | POA: Diagnosis not present

## 2014-11-09 DIAGNOSIS — Z79899 Other long term (current) drug therapy: Secondary | ICD-10-CM | POA: Insufficient documentation

## 2014-11-09 DIAGNOSIS — Z3202 Encounter for pregnancy test, result negative: Secondary | ICD-10-CM | POA: Insufficient documentation

## 2014-11-09 DIAGNOSIS — R42 Dizziness and giddiness: Secondary | ICD-10-CM | POA: Diagnosis present

## 2014-11-09 LAB — I-STAT CHEM 8, ED
BUN: 9 mg/dL (ref 6–23)
Calcium, Ion: 1.1 mmol/L — ABNORMAL LOW (ref 1.12–1.23)
Chloride: 102 mmol/L (ref 96–112)
Creatinine, Ser: 0.9 mg/dL (ref 0.50–1.10)
Glucose, Bld: 107 mg/dL — ABNORMAL HIGH (ref 70–99)
HEMATOCRIT: 40 % (ref 36.0–46.0)
Hemoglobin: 13.6 g/dL (ref 12.0–15.0)
POTASSIUM: 4.2 mmol/L (ref 3.5–5.1)
Sodium: 141 mmol/L (ref 135–145)
TCO2: 24 mmol/L (ref 0–100)

## 2014-11-09 LAB — POC URINE PREG, ED: Preg Test, Ur: NEGATIVE

## 2014-11-09 MED ORDER — SODIUM CHLORIDE 0.9 % IV BOLUS (SEPSIS)
1000.0000 mL | INTRAVENOUS | Status: AC
Start: 2014-11-09 — End: 2014-11-09
  Administered 2014-11-09: 1000 mL via INTRAVENOUS

## 2014-11-09 NOTE — Discharge Instructions (Signed)

## 2014-11-09 NOTE — ED Provider Notes (Signed)
CSN: [id number]     Arrival date & time 11/09/14  1511 History   First MD Initiated Contact with Patient 11/09/14 1555     Chief Complaint  Patient presents with  . Dizziness     (Consider location/radiation/quality/duration/timing/severity/associated sxs/prior Treatment) Patient is a 39 y.o. female presenting with dizziness. The history is provided by the patient.  Dizziness Quality:  Imbalance Severity:  Mild Onset quality:  Sudden Duration: 15 min. Timing:  Intermittent Progression:  Resolved Chronicity:  Recurrent Context: standing up   Relieved by:  Medication and being still Worsened by:  Standing up Ineffective treatments:  None tried Associated symptoms: no chest pain, no diarrhea, no headaches, no nausea, no shortness of breath and no vomiting     Past Medical History  Diagnosis Date  . Asthma   . S/P ablation of ventricular arrhythmia   . Anxiety   . Depression   . Brain lesion    Past Surgical History  Procedure Laterality Date  . Cardiac electrophysiology mapping and ablation    . Cesarean section    . Esophagogastroduodenoscopy N/A 04/23/2014    Procedure: ESOPHAGOGASTRODUODENOSCOPY (EGD);  Surgeon: Meryl Dare, MD;  Location: [facility];  Service: Endoscopy;  Laterality: N/A;   Family History  Problem Relation Age of Onset  . Diabetes Father   . Hypertension Father   . Diabetes Brother   . Heart disease Brother   . Drug abuse Brother   . Depression Brother   . Suicidality Brother   . Heart disease Maternal Grandmother   . Cancer Paternal Grandmother   . Diabetes Paternal Grandmother    History  Substance Use Topics  . Smoking status: Never Smoker   . Smokeless tobacco: Never Used  . Alcohol Use: Yes     Comment: occasionally- a few times a year   OB History    No data available     Review of Systems  Constitutional: Negative for fever and fatigue.  HENT: Negative for congestion and drooling.   Eyes: Negative for pain.   Respiratory: Negative for cough and shortness of breath.   Cardiovascular: Negative for chest pain.  Gastrointestinal: Negative for nausea, vomiting, abdominal pain and diarrhea.  Genitourinary: Negative for dysuria and hematuria.  Musculoskeletal: Negative for back pain, gait problem and neck pain.  Skin: Negative for color change.  Neurological: Positive for dizziness. Negative for headaches.  Hematological: Negative for adenopathy.  Psychiatric/Behavioral: Negative for behavioral problems.  All other systems reviewed and are negative.     Allergies  Review of patient's allergies indicates no known allergies.  Home Medications   Prior to Admission medications   Medication Sig Start Date End Date Taking? Authorizing Provider  albuterol (PROVENTIL HFA;VENTOLIN HFA) 108 (90 BASE) MCG/ACT inhaler Inhale 2 puffs into the lungs every 6 (six) hours as needed for wheezing or shortness of breath. 01/11/14   Rodolph Bong, MD  albuterol (PROVENTIL HFA;VENTOLIN HFA) 108 (90 BASE) MCG/ACT inhaler Inhale 1-2 puffs into the lungs every 6 (six) hours as needed for wheezing or shortness of breath. 10/21/14   Teressa Lower, NP  cholecalciferol (VITAMIN D) 400 UNITS TABS tablet Take 400 Units by mouth 2 (two) times a week.     Historical Provider, MD  escitalopram (LEXAPRO) 20 MG tablet Take 1 tablet (20 mg total) by mouth daily. 08/23/14   Oletta Darter, MD  meclizine (ANTIVERT) 25 MG tablet Take 1 tablet (25 mg total) by mouth 3 (three) times daily as needed for dizziness. 09/29/14  Bethann Berkshire, MD  predniSONE (DELTASONE) 20 MG tablet Take 2 tablets (40 mg total) by mouth daily. 10/21/14   Teressa Lower, NP   BP 119/72 mmHg  Pulse 74  Temp(Src) 98.1 F (36.7 C) (Oral)  Resp 16  Ht  (1.6 m)  Wt 273 lb (123.832 kg)  BMI 48.37 kg/m2  SpO2 99%  LMP 10/12/2014 Physical Exam  Constitutional: She is oriented to person, place, and time. She appears well-developed and well-nourished.   HENT:  Head: Normocephalic.  Mouth/Throat: Oropharynx is clear and moist. No oropharyngeal exudate.  Eyes: Conjunctivae and EOM are normal. Pupils are equal, round, and reactive to light.  Neck: Normal range of motion. Neck supple.  Cardiovascular: Normal rate, regular rhythm, normal heart sounds and intact distal pulses.  Exam reveals no gallop and no friction rub.   No murmur heard. Pulmonary/Chest: Effort normal and breath sounds normal. No respiratory distress. She has no wheezes.  Abdominal: Soft. Bowel sounds are normal. There is no tenderness. There is no rebound and no guarding.  Musculoskeletal: Normal range of motion. She exhibits no edema or tenderness.  Neurological: She is alert and oriented to person, place, and time.  alert, oriented x3 speech: normal in context and clarity memory: intact grossly cranial nerves II-XII: intact motor strength: full proximally and distally no involuntary movements or tremors sensation: intact to light touch diffusely  cerebellar: finger-to-nose intact gait: normal forwards and backwards, normal tandem gait   Skin: Skin is warm and dry.  Psychiatric: She has a normal mood and affect. Her behavior is normal.  Nursing note and vitals reviewed.   ED Course  Procedures (including critical care time) Labs Review Labs Reviewed  I-STAT CHEM 8, ED - Abnormal; Notable for the following:    Glucose, Bld 107 (*)    Calcium, Ion 1.10 (*)    All other components within normal limits  POC URINE PREG, ED    Imaging Review No results found.   EKG Interpretation   Date/Time:  Friday November 09 2014 16:09:50 EST Ventricular Rate:  91 PR Interval:  123 QRS Duration: 92 QT Interval:  373 QTC Calculation: 459 R Axis:   22 Text Interpretation:  Sinus rhythm Anteroseptal infarct, age indeterminate  borderline t abn's, diffuse leads No significant change since last tracing  Confirmed by Manaal Mandala  MD, Shamiah Kahler ([id number]) on 11/09/2014 6:14:03 PM       MDM   Final diagnoses:  Dizziness    4:12 PM 39 y.o. female  with pertinent past medical history of depression, syncope, and new white matter lesions found on an MRI (02/27/14) who comes to the emergency department today with dizziness. The patient is currently following with Dr. Everlena Cooper (neuro) for further workup of these lesions and has had a noncontributory MRI of her cervical and thoracic spine. I have seen her for the same symptoms approximately 1-2 months ago. 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 Upon standing around 2 PM today she began feeling dizzy. She was encouraged to sit down and took a meclizine. Her symptoms resolved about 10 to 15 min later. She has been asx since then but was encouraged to come and get checked out. She notes her dizziness is c/w dizziness she has had many times previously. Has otherwise been well, asx here, normal neuro exam, f/u w/ neuro already scheduled next week. Vital signs unremarkable here. The patient was orthostatic last time. Will give IV fluids and recheck orthostatics.  7:04  PM: Pt remains asx. Upreg neg. I interpreted/reviewed the labs and/or imaging which were non-contributory.   I have discussed the diagnosis/risks/treatment options with the patient and believe the pt to be eligible for discharge home to follow-up with her neurologist next week as scheduled. We also discussed returning to the ED immediately if new or worsening sx occur. We discussed the sx which are most concerning (e.g., worsening dizziness, weakness, numbness) that necessitate immediate return. Medications administered to the patient during their visit and any new prescriptions provided to the patient are listed below.  Medications given during this visit Medications  sodium chloride 0.9 % bolus 1,000 mL (0 mLs Intravenous Stopped 11/09/14 1754)    New Prescriptions   No medications on file     Purvis Sheffield, MD 11/09/14 1904

## 2014-11-09 NOTE — ED Notes (Signed)
Pt brought to ED via EMS due to pt dizziness episode at work. Pt has hx of same. Takes meclizine for dizziness, took med 45 min ago and is reporting relief. Pt states her hands are always numb, she has hx of brain lesions on her brain. Found in 2015 in July on MRI. Pt at [facility]. Pt a/o x4. Speaking in clear sentences.

## 2014-11-22 ENCOUNTER — Ambulatory Visit (HOSPITAL_COMMUNITY): Payer: Self-pay | Admitting: Psychiatry

## 2014-12-13 ENCOUNTER — Ambulatory Visit (INDEPENDENT_AMBULATORY_CARE_PROVIDER_SITE_OTHER): Payer: BLUE CROSS/BLUE SHIELD | Admitting: Psychiatry

## 2014-12-13 DIAGNOSIS — F332 Major depressive disorder, recurrent severe without psychotic features: Secondary | ICD-10-CM

## 2014-12-14 NOTE — Progress Notes (Signed)
   THERAPIST PROGRESS NOTE  Session Time: 2:00-3:00  Participation Level: Active  Behavioral Response: CasualAlertEuthymic  Type of Therapy: Individual Therapy  Treatment Goals addressed: Coping, self-esteem  Interventions: CBT and Strength-based  Summary: Carla Lawson is a 39 y.o. female who presents with depression.   Suicidal/Homicidal: Nowithout intent/plan  Therapist Response: This is Pt.'s first session since September 2015. Pt. Presents as talkative, laughs appropriately, makes appropriate eye contact. Pt. Reports that she was fired this week from her job because of time needed off due to neurological disorder. Pt. Reported that she is getting married in early June. Pt. Presents as excited about marriage plans. Pt. Reports that her mother is not living in her house, but has developed awareness about her mother's pattern of selfish behavior. Pt. Reports that her relationship with her son and fiance are good. Pt. Currently focusing on self-care and reports that her medication is doing well.  Plan: Return again in 3-4 weeks.  Diagnosis:Axis I: Depressive Disorder NOS  Axis II: No diagnosis   Shaune Pollack, [facility] 12/14/2014

## 2014-12-24 ENCOUNTER — Ambulatory Visit (INDEPENDENT_AMBULATORY_CARE_PROVIDER_SITE_OTHER): Payer: BLUE CROSS/BLUE SHIELD | Admitting: Neurology

## 2014-12-24 ENCOUNTER — Encounter: Payer: Self-pay | Admitting: Neurology

## 2014-12-24 VITALS — BP 122/76 | HR 86 | Resp 20 | Ht 63.0 in | Wt 273.5 lb

## 2014-12-24 DIAGNOSIS — R531 Weakness: Secondary | ICD-10-CM | POA: Diagnosis not present

## 2014-12-24 DIAGNOSIS — R930 Abnormal findings on diagnostic imaging of skull and head, not elsewhere classified: Secondary | ICD-10-CM

## 2014-12-24 DIAGNOSIS — I951 Orthostatic hypotension: Secondary | ICD-10-CM

## 2014-12-24 DIAGNOSIS — R9082 White matter disease, unspecified: Secondary | ICD-10-CM

## 2014-12-24 NOTE — Patient Instructions (Signed)
Will get MRI of brain with and without contrast to look for any changes.  If no changes, then of unknown significance and would just monitor for new symptoms

## 2014-12-24 NOTE — Progress Notes (Signed)
NEUROLOGY FOLLOW UP OFFICE NOTE  Carla Lawson [id number]  HISTORY OF PRESENT ILLNESS: Carla Lawson is a 39 year old right-handed woman with past medical history of asthma, depression, hyperlipidemia, GERD, and status post ablation of ventricular tachycardia who follows up for abnormal MRI.  Records, labs and MRI of spineal cord reviewed.  UPDATE: Given the slightly abnormal brain MRI, MRI of the cervical and thoracic spine with and without contrast was performed and was unremarkable.  It was decided to repeat the MRI of the brain in June to look for any progression.  Her vitamin D level was 21, so she was advised to start supplementation.  Over the past few months, she has had episodes of dizziness.  She has been found to be orthostatic.  Otherwise, she is doing well and has not had any recent episodes of weakness.  HISTORY: Since about 2008, she has experienced intermittent episodes of generalized weakness and slowed gait.  This started several months after ablation for ventricular tachycardia.  She would develop gradual onset of fatigue, followed by dizziness.  Her muscles would "freeze up" and she would start moving slowly, first in the upper body and then progressing to the lower extremities.  This would cause difficulty ambulating.  She would have slurred speech as well.  It would also be accompanied by short term memory problems, such as misplacing objects.  They usually last 6 to 7 hours, but have lasted up to 2 days.  Frequency varies.  She can go 6-7 months without event.  She has had 3 episodes of syncope with such events.  She has history of positive Tilt table test, of unknown etiology.  Cardiac workup, including heart monitor, was negative.  She also reports history of left sided headaches, pressure-like and non-throbbing.  They are not associated with other symptoms.  They last 6 hours.  She would usually take ibuprofen.  They occur about twice a month.  She has seen a  neurologist in the past, with negative workup, including reported unremarkable MRI of the brain.  She presented to the ED on 02/27/14 for such an episode.  An MRI of the brain without contrast was performed, which revealed nonspecific small foci of T2 hyperintensity in the subcortical and deep cerebral white matter bilaterally, sparing the corpus callosum and periventricular region.  No infratentiorial lesions were seen.  She denies family history of neurological disease.  PAST MEDICAL HISTORY: Past Medical History  Diagnosis Date  . Asthma   . S/P ablation of ventricular arrhythmia   . Anxiety   . Depression   . Brain lesion     MEDICATIONS: Current Outpatient Prescriptions on File Prior to Visit  Medication Sig Dispense Refill  . albuterol (PROVENTIL HFA;VENTOLIN HFA) 108 (90 BASE) MCG/ACT inhaler Inhale 2 puffs into the lungs every 6 (six) hours as needed for wheezing or shortness of breath. 1 Inhaler 2  . albuterol (PROVENTIL HFA;VENTOLIN HFA) 108 (90 BASE) MCG/ACT inhaler Inhale 1-2 puffs into the lungs every 6 (six) hours as needed for wheezing or shortness of breath. 1 Inhaler 0  . cholecalciferol (VITAMIN D) 400 UNITS TABS tablet Take 400 Units by mouth 2 (two) times a week.     . escitalopram (LEXAPRO) 20 MG tablet Take 1 tablet (20 mg total) by mouth daily. 30 tablet 2  . meclizine (ANTIVERT) 25 MG tablet Take 1 tablet (25 mg total) by mouth 3 (three) times daily as needed for dizziness. 30 tablet 0  . predniSONE (DELTASONE) 20  MG tablet Take 2 tablets (40 mg total) by mouth daily. (Patient not taking: Reported on 12/24/2014) 10 tablet 0   No current facility-administered medications on file prior to visit.    ALLERGIES: No Known Allergies  FAMILY HISTORY: Family History  Problem Relation Age of Onset  . Diabetes Father   . Hypertension Father   . Diabetes Brother   . Heart disease Brother   . Drug abuse Brother   . Depression Brother   . Suicidality Brother   . Heart  disease Maternal Grandmother   . Cancer Paternal Grandmother   . Diabetes Paternal Grandmother     SOCIAL HISTORY: History   Social History  . Marital Status: Single    Spouse Name: N/A  . Number of Children: N/A  . Years of Education: N/A   Occupational History  . Not on file.   Social History Main Topics  . Smoking status: Never Smoker   . Smokeless tobacco: Never Used  . Alcohol Use: Yes     Comment: occasionally- a few times a year  . Drug Use: No  . Sexual Activity:    Partners: Male    Birth Control/ Protection: None   Other Topics Concern  . Not on file   Social History Narrative    REVIEW OF SYSTEMS: Constitutional: No fevers, chills, or sweats, no generalized fatigue, change in appetite Eyes: No visual changes, double vision, eye pain Ear, nose and throat: No hearing loss, ear pain, nasal congestion, sore throat Cardiovascular: No chest pain, palpitations Respiratory:  No shortness of breath at rest or with exertion, wheezes GastrointestinaI: No nausea, vomiting, diarrhea, abdominal pain, fecal incontinence Genitourinary:  No dysuria, urinary retention or frequency Musculoskeletal:  No neck pain, back pain Integumentary: No rash, pruritus, skin lesions Neurological: as above Psychiatric: some depression Endocrine: No palpitations, fatigue, diaphoresis, mood swings, change in appetite, change in weight, increased thirst Hematologic/Lymphatic:  No anemia, purpura, petechiae. Allergic/Immunologic: no itchy/runny eyes, nasal congestion, recent allergic reactions, rashes  PHYSICAL EXAM: Filed Vitals:   12/24/14 0917  BP: 122/76  Pulse: 86  Resp: 20   General: No acute distress Head:  Normocephalic/atraumatic Eyes:  Fundoscopic exam unremarkable without vessel changes, exudates, hemorrhages or papilledema. Neck: supple, no paraspinal tenderness, full range of motion Heart:  Regular rate and rhythm Lungs:  Clear to auscultation bilaterally Back: No  paraspinal tenderness Neurological Exam: alert and oriented to person, place, and time. Attention span and concentration intact, recent and remote memory intact, fund of knowledge intact.  Speech fluent and not dysarthric, language intact.  CN II-XII intact. Fundoscopic exam unremarkable without vessel changes, exudates, hemorrhages or papilledema.  Bulk and tone normal, muscle strength 5/5 throughout.  Sensation to light touch intact.  Deep tendon reflexes 2+ throughout.  Finger to nose  testing intact.  Gait normal.  IMPRESSION: Abnormal white matter on MRI of brain.  Non-specific and of uncertain significance.  I cannot say there is any connection to her episodes of fatigue and weakness Episodes of diffuse weakness.  Vague symptoms Orthostatic hypotension Morbid obesity  PLAN: Will repeat MRI of brain with and without contrast.  If no changes, then would just monitor clinically and she may follow up as needed. Weight loss  Shon Millet, DO  CC: Doris Cheadle, MD

## 2015-01-01 ENCOUNTER — Encounter (HOSPITAL_COMMUNITY): Payer: Self-pay | Admitting: *Deleted

## 2015-01-01 ENCOUNTER — Emergency Department (HOSPITAL_COMMUNITY)
Admission: EM | Admit: 2015-01-01 | Discharge: 2015-01-02 | Disposition: A | Discharge status: Other Acute Inpatient Hospital | Payer: BLUE CROSS/BLUE SHIELD | Attending: Emergency Medicine | Admitting: Emergency Medicine

## 2015-01-01 DIAGNOSIS — Z79899 Other long term (current) drug therapy: Secondary | ICD-10-CM | POA: Insufficient documentation

## 2015-01-01 DIAGNOSIS — Z3202 Encounter for pregnancy test, result negative: Secondary | ICD-10-CM | POA: Diagnosis not present

## 2015-01-01 DIAGNOSIS — R45851 Suicidal ideations: Secondary | ICD-10-CM | POA: Diagnosis not present

## 2015-01-01 DIAGNOSIS — Z8669 Personal history of other diseases of the nervous system and sense organs: Secondary | ICD-10-CM | POA: Insufficient documentation

## 2015-01-01 DIAGNOSIS — T1491XA Suicide attempt, initial encounter: Secondary | ICD-10-CM | POA: Diagnosis present

## 2015-01-01 DIAGNOSIS — F331 Major depressive disorder, recurrent, moderate: Secondary | ICD-10-CM | POA: Diagnosis not present

## 2015-01-01 DIAGNOSIS — J45909 Unspecified asthma, uncomplicated: Secondary | ICD-10-CM | POA: Insufficient documentation

## 2015-01-01 DIAGNOSIS — F411 Generalized anxiety disorder: Secondary | ICD-10-CM | POA: Diagnosis present

## 2015-01-01 LAB — CBC
HCT: 37.1 % (ref 36.0–46.0)
Hemoglobin: 11.7 g/dL — ABNORMAL LOW (ref 12.0–15.0)
MCH: 24.2 pg — AB (ref 26.0–34.0)
MCHC: 31.5 g/dL (ref 30.0–36.0)
MCV: 76.8 fL — ABNORMAL LOW (ref 78.0–100.0)
Platelets: 275 10*3/uL (ref 150–400)
RBC: 4.83 MIL/uL (ref 3.87–5.11)
RDW: 14 % (ref 11.5–15.5)
WBC: 9.9 10*3/uL (ref 4.0–10.5)

## 2015-01-01 LAB — COMPREHENSIVE METABOLIC PANEL
ALBUMIN: 4 g/dL (ref 3.5–5.2)
ALK PHOS: 74 U/L (ref 39–117)
ALT: 15 U/L (ref 0–35)
AST: 19 U/L (ref 0–37)
Anion gap: 8 (ref 5–15)
BUN: 7 mg/dL (ref 6–23)
CHLORIDE: 106 mmol/L (ref 96–112)
CO2: 27 mmol/L (ref 19–32)
Calcium: 9.1 mg/dL (ref 8.4–10.5)
Creatinine, Ser: 0.89 mg/dL (ref 0.50–1.10)
GFR calc Af Amer: >90 mL/min (ref >90)
GFR calc non Af Amer: 80 mL/min — ABNORMAL LOW (ref >90)
Glucose, Bld: 119 mg/dL — ABNORMAL HIGH (ref 70–99)
Potassium: 3.9 mmol/L (ref 3.5–5.1)
Sodium: 141 mmol/L (ref 135–145)
TOTAL PROTEIN: 7.9 g/dL (ref 6.0–8.3)
Total Bilirubin: 0.4 mg/dL (ref 0.3–1.2)

## 2015-01-01 LAB — SALICYLATE LEVEL: Salicylate Lvl: <4 mg/dL (ref 2.8–20.0)

## 2015-01-01 LAB — POC URINE PREG, ED: Preg Test, Ur: NEGATIVE

## 2015-01-01 LAB — ETHANOL

## 2015-01-01 LAB — ACETAMINOPHEN LEVEL

## 2015-01-01 MED ORDER — ZOLPIDEM TARTRATE 5 MG PO TABS: 5.0000 mg | ORAL_TABLET | Freq: Every evening | ORAL | Status: DC | PRN

## 2015-01-01 MED ORDER — IBUPROFEN 200 MG PO TABS: 600.0000 mg | ORAL_TABLET | Freq: Three times a day (TID) | ORAL | Status: DC | PRN

## 2015-01-01 MED ORDER — LORAZEPAM 1 MG PO TABS: 1.0000 mg | ORAL_TABLET | Freq: Three times a day (TID) | ORAL | Status: DC | PRN

## 2015-01-01 NOTE — ED Provider Notes (Signed)
CSN: [id number]     Arrival date & time 01/01/15  1541 History  This chart was scribed for non-physician practitioner, Celene Skeen, PA-C, working with Mancel Bale, MD by Charline Bills, ED Scribe. This patient was seen in room WTR4/WLPT4 and the patient's care was started at 4:07 PM.   Chief Complaint  Patient presents with  . Suicidal   The history is provided by the patient. No language interpreter was used.   HPI Comments: Carla Lawson is a 39 y.o. female, with a h/o depression, anxiety, asthma, who presents to the Emergency Department complaining of suicidal ideations without a plan for a little over a week. Pt states that she has been taking Lexapro since July, prescribed by psychiatrist Oletta Darter, but does not think it is working for her. She denies any specific changes in her life that could have triggered this. She denies recent change in medications, alcohol use, drug use, pain at this time.   Past Medical History  Diagnosis Date  . Asthma   . S/P ablation of ventricular arrhythmia   . Anxiety   . Depression   . Brain lesion    Past Surgical History  Procedure Laterality Date  . Cardiac electrophysiology mapping and ablation    . Cesarean section    . Esophagogastroduodenoscopy N/A 04/23/2014    Procedure: ESOPHAGOGASTRODUODENOSCOPY (EGD);  Surgeon: Meryl Dare, MD;  Location: [facility];  Service: Endoscopy;  Laterality: N/A;   Family History  Problem Relation Age of Onset  . Diabetes Father   . Hypertension Father   . Diabetes Brother   . Heart disease Brother   . Drug abuse Brother   . Depression Brother   . Suicidality Brother   . Heart disease Maternal Grandmother   . Cancer Paternal Grandmother   . Diabetes Paternal Grandmother    History  Substance Use Topics  . Smoking status: Never Smoker   . Smokeless tobacco: Never Used  . Alcohol Use: No     Comment: occasionally- a few times a year   OB History    No data available     Review of  Systems  Musculoskeletal: Negative for myalgias and arthralgias.  Psychiatric/Behavioral: Positive for suicidal ideas.   Allergies  Review of patient's allergies indicates no known allergies.  Home Medications   Prior to Admission medications   Medication Sig Start Date End Date Taking? Authorizing Provider  albuterol (PROVENTIL HFA;VENTOLIN HFA) 108 (90 BASE) MCG/ACT inhaler Inhale 2 puffs into the lungs every 6 (six) hours as needed for wheezing or shortness of breath. 01/11/14  Yes Rodolph Bong, MD  cholecalciferol (VITAMIN D) 400 UNITS TABS tablet Take 400 Units by mouth once a week.    Yes Historical Provider, MD  escitalopram (LEXAPRO) 20 MG tablet Take 1 tablet (20 mg total) by mouth daily. 08/23/14  Yes Oletta Darter, MD  loratadine (CLARITIN) 10 MG tablet Take 10 mg by mouth daily as needed for allergies.   Yes Historical Provider, MD  meclizine (ANTIVERT) 25 MG tablet Take 1 tablet (25 mg total) by mouth 3 (three) times daily as needed for dizziness. 09/29/14  Yes Bethann Berkshire, MD  albuterol (PROVENTIL HFA;VENTOLIN HFA) 108 (90 BASE) MCG/ACT inhaler Inhale 1-2 puffs into the lungs every 6 (six) hours as needed for wheezing or shortness of breath. Patient not taking: Reported on 01/01/2015 10/21/14   Teressa Lower, NP  predniSONE (DELTASONE) 20 MG tablet Take 2 tablets (40 mg total) by mouth daily. Patient not taking:  Reported on 12/24/2014 10/21/14   Teressa Lower, NP   BP 128/73 mmHg  Pulse 78  Temp(Src) 98.7 F (37.1 C) (Oral)  Resp 16  SpO2 100%  LMP 12/26/2014 Physical Exam  Constitutional: She is oriented to person, place, and time. She appears well-developed and well-nourished. No distress.  HENT:  Head: Normocephalic and atraumatic.  Mouth/Throat: Oropharynx is clear and moist.  Eyes: Conjunctivae and EOM are normal.  Neck: Normal range of motion. Neck supple.  Cardiovascular: Normal rate, regular rhythm and normal heart sounds.   Pulmonary/Chest: Effort normal  and breath sounds normal. No respiratory distress.  Musculoskeletal: Normal range of motion. She exhibits no edema.  Neurological: She is alert and oriented to person, place, and time. No sensory deficit.  Skin: Skin is warm and dry.  Psychiatric: She has a normal mood and affect. Her behavior is normal. She expresses suicidal ideation. She expresses no homicidal ideation. She expresses no suicidal plans.  Nursing note and vitals reviewed.  ED Course  Procedures (including critical care time) DIAGNOSTIC STUDIES: Oxygen Saturation is 100% on RA, normal by my interpretation.    COORDINATION OF CARE: 4:11 PM-Discussed treatment plan which includes medical clearance with pt at bedside and pt agreed to plan.   Labs Review Labs Reviewed  ACETAMINOPHEN LEVEL - Abnormal; Notable for the following:    Acetaminophen (Tylenol), Serum <10.0 (*)    All other components within normal limits  CBC - Abnormal; Notable for the following:    Hemoglobin 11.7 (*)    MCV 76.8 (*)    MCH 24.2 (*)    All other components within normal limits  COMPREHENSIVE METABOLIC PANEL - Abnormal; Notable for the following:    Glucose, Bld 119 (*)    GFR calc non Af Amer 80 (*)    All other components within normal limits  ETHANOL  SALICYLATE LEVEL  URINE RAPID DRUG SCREEN (HOSP PERFORMED)  POC URINE PREG, ED   Imaging Review No results found.   EKG Interpretation None      MDM   Final diagnoses:  Suicidal ideation   Patient presenting with suicidal ideation. Nontoxic appearing, NAD. Medically cleared. Evaluated by TTS, suggest reevaluation by psychiatry in the morning.   I personally performed the services described in this documentation, which was scribed in my presence. The recorded information has been reviewed and is accurate.   Kathrynn Speed, PA-C 01/01/15 1956  Mancel Bale, MD 01/02/15 [phone]

## 2015-01-01 NOTE — ED Notes (Signed)
Bed: WBH41 Expected date:  Expected time:  Means of arrival:  Comments: Triage 4 

## 2015-01-01 NOTE — BH Assessment (Signed)
Assessment Note  Carla Lawson is an 38 y.o. female with depression and anxiety. She presents to [facility] stating that her medications for depression are not working. Patient's outpatient psychiatrist is Dr. Alena Bills and therapist is Victorino Dike is Boneta Lucks. Patient reports suicidal a little over 1 week. No suicidal plan. She tried to commit suicide 1x in past by ingesting a chemical (July 2015). Patient was admitted to [facility] for that suicide attempt. Current suicidal trigger includes a upcoming wedding scheduled for June 2016, hair falling out, and easily irritated. Patient denies HI and AVH's. No alcohol and drug use.   Axis I: Depressive Disorder Recurrent, Severe, without psychotic features Axis II: Deferred Axis III:  Past Medical History  Diagnosis Date  . Asthma   . S/P ablation of ventricular arrhythmia   . Anxiety   . Depression   . Brain lesion    Axis IV: other psychosocial or environmental problems, problems related to social environment, problems with access to health care services and problems with primary support group Axis V: 31-40 impairment in reality testing  Past Medical History:  Past Medical History  Diagnosis Date  . Asthma   . S/P ablation of ventricular arrhythmia   . Anxiety   . Depression   . Brain lesion     Past Surgical History  Procedure Laterality Date  . Cardiac electrophysiology mapping and ablation    . Cesarean section    . Esophagogastroduodenoscopy N/A 04/23/2014    Procedure: ESOPHAGOGASTRODUODENOSCOPY (EGD);  Surgeon: Meryl Dare, MD;  Location: [facility];  Service: Endoscopy;  Laterality: N/A;    Family History:  Family History  Problem Relation Age of Onset  . Diabetes Father   . Hypertension Father   . Diabetes Brother   . Heart disease Brother   . Drug abuse Brother   . Depression Brother   . Suicidality Brother   . Heart disease Maternal Grandmother   . Cancer Paternal Grandmother   . Diabetes Paternal  Grandmother     Social History:  reports that she has never smoked. She has never used smokeless tobacco. She reports that she does not drink alcohol or use illicit drugs.  Additional Social History:     CIWA: CIWA-Ar BP: 128/73 mmHg Pulse Rate: 78 COWS:    Allergies: No Known Allergies  Home Medications:  (Not in a hospital admission)  OB/GYN Status:  Patient's last menstrual period was 12/26/2014.  General Assessment Data Location of Assessment: [facility] ED Is this a Tele or Face-to-Face Assessment?: Face-to-Face Is this an Initial Assessment or a Re-assessment for this encounter?: Initial Assessment Living Arrangements: Children, Spouse/significant other (son and fiance) Can pt return to current living arrangement?: Yes Admission Status: Voluntary Is patient capable of signing voluntary admission?: Yes Transfer from: Acute Hospital Referral Source: Self/Family/Friend     [facility] Crisis Care Plan Living Arrangements: Children, Spouse/significant other (son and fiance) Name of Psychiatrist:  (Dr. Alena Bills ) Name of Therapist:  Boneta Lucks)  Education Status Is patient currently in school?: No Highest grade of school patient has completed:  (2 yrs of college)  Risk to self with the past 6 months Suicidal Ideation: Yes-Currently Present Suicidal Intent: Yes-Currently Present Is patient at risk for suicide?: Yes Suicidal Plan?: Yes-Currently Present Specify Current Suicidal Plan:  (patient deniees plan ) Access to Means: Yes Specify Access to Suicidal Means:  (no firearms ) What has been your use of drugs/alcohol within the last 12 months?:  (patient denies ) Previous Attempts/Gestures: Yes  How many times?:  (1x-patient drank cleaning substance June 2015) Other Self Harm Risks:  (none reported ) Triggers for Past Attempts: Other (Comment) (none reported ) Intentional Self Injurious Behavior: None (none reported ) Family Suicide History: Yes (brother-PTSD;  brother-depression) Recent stressful life event(s): Other (Comment) (wedding June 2016-"I want to call it off and not marry him") Persecutory voices/beliefs?: No Depression: Yes Depression Symptoms: Feeling angry/irritable, Feeling worthless/self pity, Loss of interest in usual pleasures, Guilt, Fatigue, Isolating, Insomnia, Despondent (no energy) Substance abuse history and/or treatment for substance abuse?: No Suicide prevention information given to non-admitted patients: Not applicable  Risk to Others within the past 6 months Homicidal Ideation: No Thoughts of Harm to Others: No Current Homicidal Intent: No Current Homicidal Plan: No Access to Homicidal Means: No Identified Victim:  (n/a) History of harm to others?: No Assessment of Violence: None Noted Violent Behavior Description:  (patient calm and cooperative ) Does patient have access to weapons?: No Criminal Charges Pending?: No Does patient have a court date: No  Psychosis Hallucinations: None noted (no current symptoms; 2 wks ago saw a ball with colors floati) Delusions: None noted  Mental Status Report Appearance/Hygiene: Disheveled Eye Contact: Good Motor Activity: Freedom of movement Speech: Logical/coherent Level of Consciousness: Alert Mood: Depressed Affect: Appropriate to circumstance Anxiety Level: Panic Attacks Panic attack frequency:  ("Whenever I get extremely upset") Most recent panic attack:  (March 2016) Thought Processes: Coherent Judgement: Unimpaired Orientation: Place, Time, Situation, Person Obsessive Compulsive Thoughts/Behaviors: None  Cognitive Functioning Concentration: Decreased Memory: Recent Intact, Remote Intact IQ: Average Insight: Poor Impulse Control: Fair Appetite: Good Weight Loss:  (none reported ) Weight Gain:  (pt admits to wt. gain but unsure of the amount) Sleep: Increased Total Hours of Sleep:  (8+ hrs ) Vegetative Symptoms: Decreased grooming  ADLScreening [facility]  Assessment Services) Patient's cognitive ability adequate to safely complete daily activities?: Yes Patient able to express need for assistance with ADLs?: Yes Independently performs ADLs?: Yes (appropriate for developmental age)  Prior Inpatient Therapy Prior Inpatient Therapy: Yes Prior Therapy Dates:  ([facility]-June 2015) Prior Therapy Facilty/Provider(s):  ([facility]) Reason for Treatment:  (depression )  Prior Outpatient Therapy Prior Outpatient Therapy: Yes Prior Therapy Dates:  (current ) Prior Therapy Facilty/Provider(s):  (Dr. Alena Bills and Boneta Lucks) Reason for Treatment:  (depression and medication management )  ADL Screening (condition at time of admission) Patient's cognitive ability adequate to safely complete daily activities?: Yes Patient able to express need for assistance with ADLs?: Yes Independently performs ADLs?: Yes (appropriate for developmental age)             Advance Directives (For Healthcare) Does patient have an advance directive?: No Would patient like information on creating an advanced directive?: No - patient declined information    Additional Information 1:1 In Past 12 Months?: No CIRT Risk: No Elopement Risk: No Does patient have medical clearance?: Yes     Disposition:  Disposition Initial Assessment Completed for this Encounter: Yes Disposition of Patient: Inpatient treatment program   Nanine Means, NP recommends re-evaluation in the morning by psychiatry.   On Site Evaluation by:   Reviewed with Physician:    Melynda Ripple [facility] 01/01/2015 5:05 PM

## 2015-01-01 NOTE — ED Notes (Signed)
TTS at bedside. 

## 2015-01-01 NOTE — ED Notes (Signed)
Patient states that she began taking lexapro in July and she does not feel like it is working because the thoughts of suicide are back. She states, " I feel like I'm crashing". She denies any changes in life that could have triggered these thoughts. She states the suicidal thoughts have present for a week. She denies a plan.

## 2015-01-02 ENCOUNTER — Observation Stay (HOSPITAL_COMMUNITY)
Admission: AD | Admit: 2015-01-02 | Discharge: 2015-01-03 | Disposition: A | Discharge status: Home / Self Care | Payer: BLUE CROSS/BLUE SHIELD | Source: Intra-hospital | Attending: Psychiatry | Admitting: Psychiatry

## 2015-01-02 ENCOUNTER — Encounter (HOSPITAL_COMMUNITY): Payer: Self-pay | Admitting: *Deleted

## 2015-01-02 DIAGNOSIS — R45851 Suicidal ideations: Secondary | ICD-10-CM | POA: Insufficient documentation

## 2015-01-02 DIAGNOSIS — F331 Major depressive disorder, recurrent, moderate: Principal | ICD-10-CM | POA: Insufficient documentation

## 2015-01-02 DIAGNOSIS — F419 Anxiety disorder, unspecified: Secondary | ICD-10-CM | POA: Diagnosis not present

## 2015-01-02 DIAGNOSIS — K219 Gastro-esophageal reflux disease without esophagitis: Secondary | ICD-10-CM | POA: Insufficient documentation

## 2015-01-02 DIAGNOSIS — J45909 Unspecified asthma, uncomplicated: Secondary | ICD-10-CM | POA: Diagnosis not present

## 2015-01-02 DIAGNOSIS — F411 Generalized anxiety disorder: Secondary | ICD-10-CM

## 2015-01-02 DIAGNOSIS — F332 Major depressive disorder, recurrent severe without psychotic features: Secondary | ICD-10-CM

## 2015-01-02 DIAGNOSIS — F321 Major depressive disorder, single episode, moderate: Secondary | ICD-10-CM | POA: Insufficient documentation

## 2015-01-02 MED ORDER — ALBUTEROL SULFATE HFA 108 (90 BASE) MCG/ACT IN AERS: 2.0000 | INHALATION_SPRAY | Freq: Four times a day (QID) | RESPIRATORY_TRACT | Status: DC | PRN

## 2015-01-02 MED ORDER — ESCITALOPRAM OXALATE 10 MG PO TABS
20.0000 mg | ORAL_TABLET | Freq: Every day | ORAL | Status: DC
  Administered 2015-01-02: 20 mg via ORAL
  Filled 2015-01-02: qty 2

## 2015-01-02 MED ORDER — MECLIZINE HCL 25 MG PO TABS: 25.0000 mg | ORAL_TABLET | Freq: Three times a day (TID) | ORAL | Status: DC | PRN

## 2015-01-02 MED ORDER — LORAZEPAM 1 MG PO TABS: 1.0000 mg | ORAL_TABLET | Freq: Three times a day (TID) | ORAL | Status: DC | PRN

## 2015-01-02 MED ORDER — ESCITALOPRAM OXALATE 20 MG PO TABS
20.0000 mg | ORAL_TABLET | Freq: Every day | ORAL | Status: DC
  Administered 2015-01-03: 20 mg via ORAL
  Filled 2015-01-02 (×2): qty 1
  Filled 2015-01-02 (×2): qty 2

## 2015-01-02 MED ORDER — CHOLECALCIFEROL 10 MCG (400 UNIT) PO TABS
400.0000 [IU] | ORAL_TABLET | ORAL | Status: DC
  Filled 2015-01-02: qty 1

## 2015-01-02 MED ORDER — LORATADINE 10 MG PO TABS
10.0000 mg | ORAL_TABLET | Freq: Every day | ORAL | Status: DC | PRN
  Filled 2015-01-02: qty 1

## 2015-01-02 MED ORDER — IBUPROFEN 600 MG PO TABS: 600.0000 mg | ORAL_TABLET | Freq: Three times a day (TID) | ORAL | Status: DC | PRN

## 2015-01-02 MED ORDER — LORATADINE 10 MG PO TABS: 10.0000 mg | ORAL_TABLET | Freq: Every day | ORAL | Status: DC | PRN

## 2015-01-02 MED ORDER — ALBUTEROL SULFATE HFA 108 (90 BASE) MCG/ACT IN AERS
2.0000 | INHALATION_SPRAY | Freq: Four times a day (QID) | RESPIRATORY_TRACT | Status: DC | PRN
Start: 2015-01-02 — End: 2015-01-03

## 2015-01-02 MED ORDER — ZOLPIDEM TARTRATE 5 MG PO TABS
5.0000 mg | ORAL_TABLET | Freq: Every evening | ORAL | Status: DC | PRN
Start: 2015-01-02 — End: 2015-01-03
  Administered 2015-01-02: 5 mg via ORAL
  Filled 2015-01-02: qty 1

## 2015-01-02 MED ORDER — MECLIZINE HCL 25 MG PO TABS
25.0000 mg | ORAL_TABLET | Freq: Three times a day (TID) | ORAL | Status: DC | PRN
  Filled 2015-01-02: qty 1

## 2015-01-02 NOTE — ED Notes (Signed)
Patient resting quietly in bed with eyes closed, Respirations equal and unlabored, skin warm and dry, NAD. No change in assessment or acuity. Q 15 minute safety checks remain in place.   

## 2015-01-02 NOTE — Progress Notes (Signed)
Pt alert and cooperative. Affect/ mood sad and depressed. -SI/HI, -A/[place], verbally contracts for safety. Pt discussed her stressors related to finances, planning a wedding for June and hair loss. Emotional support and encouragement given. Will continue to monitor closely.

## 2015-01-02 NOTE — Plan of Care (Signed)
[facility] Observation Crisis Plan  Reason for Crisis Plan:  Crisis Stabilization   Plan of Care:  Referral for Telepsychiatry/Psychiatric Consult  Family Support:      Current Living Environment:  Living Arrangements: Children, Spouse/significant other  Insurance:   Hospital Account    Name Acct ID Class Status Primary Coverage   Carla Lawson, Carla Lawson [id number] BEHAVIORAL HEALTH OBSERVATION Open BLUE CROSS BLUE SHIELD - BCBS OTHER        Guarantor Account (for Hospital Account [id number])    Name Relation to Pt Service Area Active? Acct Type   Carla Lawson Self [facility] Yes Behavioral Health   Address Phone       [address] [place], [place] [address] [phone](H)          Coverage Information (for Hospital Account [id number])    F/O Payor/Plan Precert #   [facility] SHIELD/BCBS OTHER    Subscriber Subscriber #   Carla, Lawson [id number]   Address Phone   [address] [place], [place] [address] [phone]      Legal Guardian:     Primary Care Provider:  Doris Cheadle, MD  Current Outpatient Providers:  [facility]  Psychiatrist:  Name of Psychiatrist: Dr Carla Lawson  Counselor/Therapist:  Name of Therapist: Boneta Lawson  Compliant with Medications:  No  Additional Information:   Carla Lawson 4/27/20165:50 PM

## 2015-01-02 NOTE — Progress Notes (Signed)
Patient admitted to observation unit after presenting to [facility] with thoughts of suicide.  Did not report plan but stated she had felt that way for about a week. Patient stated that in the past she had attempted suicide by drinking coffee pot cleaner. Patient reported she was in [place] for 8 days after this event in July..  Patient stated that she had been staying in bed sleeping 12 to 14 hours a day for the past 2 weeks. Stressors are boyfriends loss of job making her the sole money earner and raising 40 yr old son. Patient also states she is stressing over her wedding planned for June 14.  She states this is stress because she doesn't have money to pay. Patient denies HI and AVH. Patient has flat affect and talks softly, moving slowly.Oriented to unit. Nutrition offered. Education provided regarding safety and falls. Safety checks started every 15 minutes.

## 2015-01-02 NOTE — BH Assessment (Signed)
[facility] Assessment Progress Note  Per Thedore Mins, MD, this pt would benefit from admission to the [facility] Observation Unit at this time.  Berneice Heinrich, RN, [facility] has assigned pt to Obs 4.  Pt has signed Voluntary Admission and Consent for Treatment, as well as Consent to Release Information to Dr Michae Kava and to Boneta Lucks, and signed forms have been faxed to [facility].  Pt's nurse, Lincoln Maxin, has been notified, and agrees to send original paperwork along with pt via Juel Burrow, and to call report to [phone].  Doylene Canning, MA Triage Specialist [phone]

## 2015-01-02 NOTE — Progress Notes (Signed)
[facility] INPATIENT:  Family/Significant Other Suicide Prevention Education  Suicide Prevention Education:  Patient Refusal for Family/Significant Other Suicide Prevention Education: The patient Carla Lawson has refused to provide written consent for family/significant other to be provided Family/Significant Other Suicide Prevention Education during admission and/or prior to discharge.  Physician notified.  [name] 01/02/2015, 5:53 PM

## 2015-01-02 NOTE — Consult Note (Signed)
Carla Lawson   Reason for Lawson:  Suicidal ideations Referring Physician:  EDP Patient Identification: Carla Lawson MRN:  [id number] Principal Diagnosis: Depression, major, recurrent, moderate Diagnosis:   Patient Active Problem List   Diagnosis Date Noted  . Depression, major, recurrent, moderate [F33.1] 01/02/2015    Priority: High  . GAD (generalized anxiety disorder) [F41.1] 06/21/2014    Priority: High  . White matter abnormality on MRI of brain [R93.0] 12/24/2014  . Major depressive disorder, recurrent, severe without psychotic features [F33.2] 06/21/2014  . Ingestion of toxin [T65.91XA] 04/22/2014  . GERD without esophagitis [K21.9] 04/22/2014  . Asthma [J45.909] 04/22/2014  . Suicidal intent [R45.851] 04/22/2014  . SYNCOPE AND COLLAPSE [R55] 04/03/2009  . PALPITATIONS [R00.2] 04/03/2009  . CHEST PAIN, ATYPICAL [R07.89] 04/03/2009  . HYPERCHOLESTEROLEMIA [E78.0] 04/02/2009  . 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 female patient admitted to [facility] Observation Unit for stabilization.  HPI:  The patient has been stressed out about paying all the bill by herself, since her boyfriend got laid off about a month ago.  She is supporting him and her 30 yo son.  Carla Lawson has also had issues with her asthma causing her an increase in anxiety this week.  She began to have passive suicidal ideations but none on assessment this morning, depression 5/10.  She had a prior attempt by drinking coffee pot cleaner last July and does not feel she is at this point, wants to get help before she gets there.  Carla Lawson is a patient of Dr. Doyne Keel at [facility] (last visit in December) and saw a therapist in March.  She wants to go back to therapy and reconnected to help her over this difficult time in her life.  Denies hallucinations and alcohol/drug use.  She has been taking Lexapro 20 mg  daily for depression and Meclizine for dizziness along with Albuterol for her asthma. HPI Elements:   Location:  generalized. Quality:  acute. Severity:  mild to moderate. Timing:  intermittent. Duration:  one week. Context:  stressors.  Past Medical History:  Past Medical History  Diagnosis Date  . Asthma   . S/P ablation of ventricular arrhythmia   . Anxiety   . Depression   . Brain lesion     Past Surgical History  Procedure Laterality Date  . Cardiac electrophysiology mapping and ablation    . Cesarean section    . Esophagogastroduodenoscopy N/A 04/23/2014    Procedure: ESOPHAGOGASTRODUODENOSCOPY (EGD);  Surgeon: Ladene Artist, MD;  Location: [facility];  Service: Endoscopy;  Laterality: N/A;   Family History:  Family History  Problem Relation Age of Onset  . Diabetes Father   . Hypertension Father   . Diabetes Brother   . Heart disease Brother   . Drug abuse Brother   . Depression Brother   . Suicidality Brother   . Heart disease Maternal Grandmother   . Cancer Paternal Grandmother   . Diabetes Paternal Grandmother    Social History:  History  Alcohol Use No    Comment: occasionally- a few times a year     History  Drug Use No    History   Social History  . Marital Status: Single    Spouse Name: N/A  . Number of Children: N/A  . Years of Education: N/A   Social History Main Topics  . Smoking status: Never Smoker   .  Smokeless tobacco: Never Used  . Alcohol Use: No     Comment: occasionally- a few times a year  . Drug Use: No  . Sexual Activity:    Partners: Male    Birth Control/ Protection: None   Other Topics Concern  . None   Social History Narrative   Additional Social History:                          Allergies:  No Known Allergies  Labs:  Results for orders placed or performed during the hospital encounter of 01/01/15 (from the past 48 hour(s))  Acetaminophen level     Status: Abnormal   Collection Time: 01/01/15   4:36 PM  Result Value Ref Range   Acetaminophen (Tylenol), Serum <10.0 (L) 10 - 30 ug/mL    Comment:        THERAPEUTIC CONCENTRATIONS VARY SIGNIFICANTLY. A RANGE OF 10-30 ug/mL MAY BE AN EFFECTIVE CONCENTRATION FOR MANY PATIENTS. HOWEVER, SOME ARE BEST TREATED AT CONCENTRATIONS OUTSIDE THIS RANGE. ACETAMINOPHEN CONCENTRATIONS >150 ug/mL AT 4 HOURS AFTER INGESTION AND >50 ug/mL AT 12 HOURS AFTER INGESTION ARE OFTEN ASSOCIATED WITH TOXIC REACTIONS.   CBC     Status: Abnormal   Collection Time: 01/01/15  4:36 PM  Result Value Ref Range   WBC 9.9 4.0 - 10.5 K/uL   RBC 4.83 3.87 - 5.11 MIL/uL   Hemoglobin 11.7 (L) 12.0 - 15.0 g/dL   HCT 37.1 36.0 - 46.0 %   MCV 76.8 (L) 78.0 - 100.0 fL   MCH 24.2 (L) 26.0 - 34.0 pg   MCHC 31.5 30.0 - 36.0 g/dL   RDW 14.0 11.5 - 15.5 %   Platelets 275 150 - 400 K/uL  Comprehensive metabolic panel     Status: Abnormal   Collection Time: 01/01/15  4:36 PM  Result Value Ref Range   Sodium 141 135 - 145 mmol/L   Potassium 3.9 3.5 - 5.1 mmol/L   Chloride 106 96 - 112 mmol/L   CO2 27 19 - 32 mmol/L   Glucose, Bld 119 (H) 70 - 99 mg/dL   BUN 7 6 - 23 mg/dL   Creatinine, Ser 0.89 0.50 - 1.10 mg/dL   Calcium 9.1 8.4 - 10.5 mg/dL   Total Protein 7.9 6.0 - 8.3 g/dL   Albumin 4.0 3.5 - 5.2 g/dL   AST 19 0 - 37 U/L   ALT 15 0 - 35 U/L   Alkaline Phosphatase 74 39 - 117 U/L   Total Bilirubin 0.4 0.3 - 1.2 mg/dL   GFR calc non Af Amer 80 (L) >90 mL/min   GFR calc Af Amer >90 >90 mL/min    Comment: (NOTE) The eGFR has been calculated using the CKD EPI equation. This calculation has not been validated in all clinical situations. eGFR's persistently <90 mL/min signify possible Chronic Kidney Disease.    Anion gap 8 5 - 15  Ethanol (ETOH)     Status: None   Collection Time: 01/01/15  4:36 PM  Result Value Ref Range   Alcohol, Ethyl (B) <5 0 - 9 mg/dL    Comment:        LOWEST DETECTABLE LIMIT FOR SERUM ALCOHOL IS 11 mg/dL FOR MEDICAL PURPOSES  ONLY   Salicylate level     Status: None   Collection Time: 01/01/15  4:36 PM  Result Value Ref Range   Salicylate Lvl <3.4 2.8 - 20.0 mg/dL  POC Urine Pregnancy, ED (do  NOT order at [facility])     Status: None   Collection Time: 01/01/15  6:38 PM  Result Value Ref Range   Preg Test, Ur NEGATIVE NEGATIVE    Comment:        THE SENSITIVITY OF THIS METHODOLOGY IS >24 mIU/mL     Vitals: Blood pressure 117/76, pulse 64, temperature 98.7 F (37.1 C), temperature source Oral, resp. rate 20, last menstrual period 12/26/2014, SpO2 100 %.  Risk to Self: Suicidal Ideation: Yes-Currently Present Suicidal Intent: Yes-Currently Present Is patient at risk for suicide?: Yes Suicidal Plan?: Yes-Currently Present Specify Current Suicidal Plan:  (patient deniees plan ) Access to Means: Yes Specify Access to Suicidal Means:  (no firearms ) What has been your use of drugs/alcohol within the last 12 months?:  (patient denies ) How many times?:  (1x-patient drank cleaning substance June 2015) Other Self Harm Risks:  (none reported ) Triggers for Past Attempts: Other (Comment) (none reported ) Intentional Self Injurious Behavior: None (none reported ) Risk to Others: Homicidal Ideation: No Thoughts of Harm to Others: No Current Homicidal Intent: No Current Homicidal Plan: No Access to Homicidal Means: No Identified Victim:  (n/a) History of harm to others?: No Assessment of Violence: None Noted Violent Behavior Description:  (patient calm and cooperative ) Does patient have access to weapons?: No Criminal Charges Pending?: No Does patient have a court date: No Prior Inpatient Therapy: Prior Inpatient Therapy: Yes Prior Therapy Dates:  ([facility]-June 2015) Prior Therapy Facilty/Provider(s):  ([facility]) Reason for Treatment:  (depression ) Prior Outpatient Therapy: Prior Outpatient Therapy: Yes Prior Therapy Dates:  (current ) Prior Therapy Facilty/Provider(s):  (Dr. Darrall Dears and Eloise Levels) Reason for Treatment:  (depression and medication management )  Current Facility-Administered Medications  Medication Dose Route Frequency Provider Last Rate Last Dose  . ibuprofen (ADVIL,MOTRIN) tablet 600 mg  600 mg Oral Q8H PRN Carman Ching, PA-C      . LORazepam (ATIVAN) tablet 1 mg  1 mg Oral Q8H PRN Robyn M Hess, PA-C      . zolpidem (AMBIEN) tablet 5 mg  5 mg Oral QHS PRN Carman Ching, PA-C       Current Outpatient Prescriptions  Medication Sig Dispense Refill  . albuterol (PROVENTIL HFA;VENTOLIN HFA) 108 (90 BASE) MCG/ACT inhaler Inhale 2 puffs into the lungs every 6 (six) hours as needed for wheezing or shortness of breath. 1 Inhaler 2  . cholecalciferol (VITAMIN D) 400 UNITS TABS tablet Take 400 Units by mouth once a week.     . escitalopram (LEXAPRO) 20 MG tablet Take 1 tablet (20 mg total) by mouth daily. 30 tablet 2  . loratadine (CLARITIN) 10 MG tablet Take 10 mg by mouth daily as needed for allergies.    Marland Kitchen meclizine (ANTIVERT) 25 MG tablet Take 1 tablet (25 mg total) by mouth 3 (three) times daily as needed for dizziness. 30 tablet 0  . albuterol (PROVENTIL HFA;VENTOLIN HFA) 108 (90 BASE) MCG/ACT inhaler Inhale 1-2 puffs into the lungs every 6 (six) hours as needed for wheezing or shortness of breath. (Patient not taking: Reported on 01/01/2015) 1 Inhaler 0  . predniSONE (DELTASONE) 20 MG tablet Take 2 tablets (40 mg total) by mouth daily. (Patient not taking: Reported on 12/24/2014) 10 tablet 0    Musculoskeletal: Strength & Muscle Tone: within normal limits Gait & Station: normal Patient leans: N/A  Psychiatric Specialty Exam:     Blood pressure 117/76, pulse 64, temperature 98.7 F (37.1 C),  temperature source Oral, resp. rate 20, last menstrual period 12/26/2014, SpO2 100 %.There is no weight on file to calculate BMI.  General Appearance: Casual  Eye Contact::  Fair  Speech:  Normal Rate  Volume:  Normal  Mood:  Depressed  Affect:  Congruent  Thought  Process:  Coherent  Orientation:  Full (Time, Place, and Person)  Thought Content:  WDL  Suicidal Thoughts:  No  Homicidal Thoughts:  No  Memory:  Immediate;   Good Recent;   Good Remote;   Good  Judgement:  Good  Insight:  Good  Psychomotor Activity:  Normal  Concentration:  Good  Recall:  Good  Fund of Knowledge:Good  Language: Good  Akathisia:  No  Handed:  Right  AIMS (if indicated):     Assets:  Catering manager Housing Physical Health Resilience Social Support Vocational/Educational  ADL's:  Intact  Cognition: WNL  Sleep:      Medical Decision Making: Review of Psycho-Social Stressors (1), Review or order clinical lab tests (1) and Review of Medication Regimen & Side Effects (2)  Treatment Plan Summary: Daily contact with patient to assess and evaluate symptoms and progress in treatment, Medication management and Plan admit to [facility] observation unit for stabilization  Plan:  Supportive therapy provided about ongoing stressors. Disposition: admit to [facility] observation unit for stabilization  Waylan Boga, PMH-NP 01/02/2015 1:42 PM Patient seen face-to-face for psychiatric evaluation, chart reviewed and case discussed with the physician extender and developed treatment plan. Reviewed the information documented and agree with the treatment plan. Corena Pilgrim, MD

## 2015-01-02 NOTE — ED Notes (Signed)
Patient admits to [facility] without a plan. Patient denies HI and AVH at this time. Patient reports that she has had these thoughts for about a week. Patient reports some of the stressors contributing to these thoughts is a upcoming wedding scheduled for June 2016, hair falling out, and that the fact that she is easily irritated. Plan of care discussed with patient. Patient voices no complaints are concerns. Encouragement and support provided. Q 15 min safety checks remain in place.

## 2015-01-02 NOTE — Progress Notes (Signed)
  [facility] Assessment Progress Note  Counselor spoke to pt concerning discharge planning. Pt denied SI, HI, or AVH. She indicated that she presented to [facility] due to life stressors that were overwhelming her. She identified several of her stressors, the primary stressor being financial issues.   Pt shared that she has therapy with Boneta Lucks and psychiatry with Dr. Michae Kava. Both providers are with [facility] OP dept. She shared that she is to see Victorino Dike every month and Dr. Michae Kava every 3 months. She indicated that last saw Victorino Dike in March and Dr. Michae Kava in November. She indicated that she did not have any upcoming appts with these providers and was not sure why follow up appts were not made at her last appts. She shared that this was also a reason she presented to [facility]-she was not able to get in to see her current providers.   Pt shared that she is interested in participating in [facility] IOP program. She indicated that she participated in the IOP program in September and found it to be beneficial to her.  Marcelle Smiling, MS Counselor

## 2015-01-03 ENCOUNTER — Telehealth (HOSPITAL_COMMUNITY): Payer: Self-pay

## 2015-01-03 DIAGNOSIS — F331 Major depressive disorder, recurrent, moderate: Secondary | ICD-10-CM

## 2015-01-03 MED ORDER — ESCITALOPRAM OXALATE 20 MG PO TABS: 20.0000 mg | ORAL_TABLET | Freq: Every day | ORAL | Status: DC

## 2015-01-03 MED ORDER — CHOLECALCIFEROL 10 MCG (400 UNIT) PO TABS: 400.0000 [IU] | ORAL_TABLET | ORAL | Status: DC

## 2015-01-03 NOTE — Progress Notes (Signed)
[facility] Assessment Progress Note   Pt has signed a No Harm Contract.   Marcelle Smiling, MS Counselor

## 2015-01-03 NOTE — Discharge Summary (Signed)
[facility] OBS UNIT DISCHARGE SUMMARY  Patient Identification: Carla Lawson MRN:  [id number] Principal Diagnosis: Depression, major, recurrent, moderate Diagnosis:   Patient Active Problem List   Diagnosis Date Noted  . Depression, major, recurrent, moderate [F33.1] 01/02/2015  . Depression, major, single episode, moderate [F32.1] 01/02/2015  . Suicidal ideation [R45.851]   . White matter abnormality on MRI of brain [R93.0] 12/24/2014  . Major depressive disorder, recurrent, severe without psychotic features [F33.2] 06/21/2014  . GAD (generalized anxiety disorder) [F41.1] 06/21/2014  . Ingestion of toxin [T65.91XA] 04/22/2014  . GERD without esophagitis [K21.9] 04/22/2014  . Asthma [J45.909] 04/22/2014  . Suicidal intent [R45.851] 04/22/2014  . SYNCOPE AND COLLAPSE [R55] 04/03/2009  . PALPITATIONS [R00.2] 04/03/2009  . CHEST PAIN, ATYPICAL [R07.89] 04/03/2009  . HYPERCHOLESTEROLEMIA [E78.0] 04/02/2009  . 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 female patient admitted to [facility] Observation Unit for stabilization. Pt spent the night in the [facility] OBS without incident. Pt denies suicidal/homicidal ideation and hallucinations. She is able to contract for safety. Pt is seen outpatient by Dr. Doyne Keel and reports a good therapeutic relationship. She would like to continue to followup with her upon discharge from the OBS Unit.   HPI:  Carla Lawson is an 39 y.o. female with depression and anxiety. She presents to [facility] stating that her medications for depression are not working. Patient's outpatient psychiatrist is Dr. Darrall Dears and therapist is Anderson Malta is Eloise Levels. Patient reports suicidal a little over 1 week. No suicidal plan. She tried to commit suicide 1x in past by ingesting a chemical (July 2015). Patient was admitted to [facility] for that suicide attempt. Current suicidal trigger includes a  upcoming wedding scheduled for June 2016, hair falling out, and easily irritated. Patient denies HI and AVH's. No alcohol and drug use.   HPI Elements:   Location:  Psychiatric. Quality:  Improving. Severity:  mild to moderate. Timing:  intermittent. Duration:  Transient Context:  Family dynamic strain  Past Medical History:  Past Medical History  Diagnosis Date  . Asthma   . S/P ablation of ventricular arrhythmia   . Anxiety   . Depression   . Brain lesion     Past Surgical History  Procedure Laterality Date  . Cardiac electrophysiology mapping and ablation    . Cesarean section    . Esophagogastroduodenoscopy N/A 04/23/2014    Procedure: ESOPHAGOGASTRODUODENOSCOPY (EGD);  Surgeon: Ladene Artist, MD;  Location: [facility];  Service: Endoscopy;  Laterality: N/A;   Family History:  Family History  Problem Relation Age of Onset  . Diabetes Father   . Hypertension Father   . Diabetes Brother   . Heart disease Brother   . Drug abuse Brother   . Depression Brother   . Suicidality Brother   . Heart disease Maternal Grandmother   . Cancer Paternal Grandmother   . Diabetes Paternal Grandmother    Social History:  History  Alcohol Use No    Comment: occasionally- a few times a year     History  Drug Use No    History   Social History  . Marital Status: Single    Spouse Name: N/A  . Number of Children: N/A  . Years of Education: N/A   Social History Main Topics  . Smoking status: Never Smoker   . Smokeless tobacco: Never Used  . Alcohol Use: No  Comment: occasionally- a few times a year  . Drug Use: No  . Sexual Activity:    Partners: Male    Birth Control/ Protection: None   Other Topics Concern  . None   Social History Narrative   Additional Social History:    Pain Medications: denies Prescriptions: denies Over the Counter: denies History of alcohol / drug use?: No history of alcohol / drug abuse                     Allergies:  No  Known Allergies  Labs:  Results for orders placed or performed during the hospital encounter of 01/01/15 (from the past 48 hour(s))  Acetaminophen level     Status: Abnormal   Collection Time: 01/01/15  4:36 PM  Result Value Ref Range   Acetaminophen (Tylenol), Serum <10.0 (L) 10 - 30 ug/mL    Comment:        THERAPEUTIC CONCENTRATIONS VARY SIGNIFICANTLY. A RANGE OF 10-30 ug/mL MAY BE AN EFFECTIVE CONCENTRATION FOR MANY PATIENTS. HOWEVER, SOME ARE BEST TREATED AT CONCENTRATIONS OUTSIDE THIS RANGE. ACETAMINOPHEN CONCENTRATIONS >150 ug/mL AT 4 HOURS AFTER INGESTION AND >50 ug/mL AT 12 HOURS AFTER INGESTION ARE OFTEN ASSOCIATED WITH TOXIC REACTIONS.   CBC     Status: Abnormal   Collection Time: 01/01/15  4:36 PM  Result Value Ref Range   WBC 9.9 4.0 - 10.5 K/uL   RBC 4.83 3.87 - 5.11 MIL/uL   Hemoglobin 11.7 (L) 12.0 - 15.0 g/dL   HCT 37.1 36.0 - 46.0 %   MCV 76.8 (L) 78.0 - 100.0 fL   MCH 24.2 (L) 26.0 - 34.0 pg   MCHC 31.5 30.0 - 36.0 g/dL   RDW 14.0 11.5 - 15.5 %   Platelets 275 150 - 400 K/uL  Comprehensive metabolic panel     Status: Abnormal   Collection Time: 01/01/15  4:36 PM  Result Value Ref Range   Sodium 141 135 - 145 mmol/L   Potassium 3.9 3.5 - 5.1 mmol/L   Chloride 106 96 - 112 mmol/L   CO2 27 19 - 32 mmol/L   Glucose, Bld 119 (H) 70 - 99 mg/dL   BUN 7 6 - 23 mg/dL   Creatinine, Ser 0.89 0.50 - 1.10 mg/dL   Calcium 9.1 8.4 - 10.5 mg/dL   Total Protein 7.9 6.0 - 8.3 g/dL   Albumin 4.0 3.5 - 5.2 g/dL   AST 19 0 - 37 U/L   ALT 15 0 - 35 U/L   Alkaline Phosphatase 74 39 - 117 U/L   Total Bilirubin 0.4 0.3 - 1.2 mg/dL   GFR calc non Af Amer 80 (L) >90 mL/min   GFR calc Af Amer >90 >90 mL/min    Comment: (NOTE) The eGFR has been calculated using the CKD EPI equation. This calculation has not been validated in all clinical situations. eGFR's persistently <90 mL/min signify possible Chronic Kidney Disease.    Anion gap 8 5 - 15  Ethanol (ETOH)      Status: None   Collection Time: 01/01/15  4:36 PM  Result Value Ref Range   Alcohol, Ethyl (B) <5 0 - 9 mg/dL    Comment:        LOWEST DETECTABLE LIMIT FOR SERUM ALCOHOL IS 11 mg/dL FOR MEDICAL PURPOSES ONLY   Salicylate level     Status: None   Collection Time: 01/01/15  4:36 PM  Result Value Ref Range   Salicylate Lvl <2.5 2.8 - 20.0  mg/dL  POC Urine Pregnancy, ED (do NOT order at [facility])     Status: None   Collection Time: 01/01/15  6:38 PM  Result Value Ref Range   Preg Test, Ur NEGATIVE NEGATIVE    Comment:        THE SENSITIVITY OF THIS METHODOLOGY IS >24 mIU/mL     Vitals: Blood pressure 102/70, pulse 92, temperature 98.7 F (37.1 C), temperature source Oral, resp. rate 20, height 5' 3"  (1.6 m), weight 123.832 kg (273 lb), last menstrual period 12/26/2014, SpO2 100 %.  Risk to Self: Is patient at risk for suicide?: No Risk to Others:   Prior Inpatient Therapy:   Prior Outpatient Therapy:    Current Facility-Administered Medications  Medication Dose Route Frequency Provider Last Rate Last Dose  . albuterol (PROVENTIL HFA;VENTOLIN HFA) 108 (90 BASE) MCG/ACT inhaler 2 puff  2 puff Inhalation Q6H PRN Patrecia Pour, NP      . cholecalciferol (VITAMIN D) tablet 400 Units  400 Units Oral Weekly Patrecia Pour, NP      . escitalopram (LEXAPRO) tablet 20 mg  20 mg Oral Daily Patrecia Pour, NP   20 mg at 01/03/15 6301  . ibuprofen (ADVIL,MOTRIN) tablet 600 mg  600 mg Oral Q8H PRN Patrecia Pour, NP      . loratadine (CLARITIN) tablet 10 mg  10 mg Oral Daily PRN Patrecia Pour, NP      . LORazepam (ATIVAN) tablet 1 mg  1 mg Oral Q8H PRN Patrecia Pour, NP      . meclizine (ANTIVERT) tablet 25 mg  25 mg Oral TID PRN Patrecia Pour, NP      . zolpidem (AMBIEN) tablet 5 mg  5 mg Oral QHS PRN Patrecia Pour, NP   5 mg at 01/02/15 2304    Musculoskeletal: Strength & Muscle Tone: within normal limits Gait & Station: normal Patient leans: N/A  Psychiatric Specialty Exam:      Blood pressure 102/70, pulse 92, temperature 98.7 F (37.1 C), temperature source Oral, resp. rate 20, height 5' 3"  (1.6 m), weight 123.832 kg (273 lb), last menstrual period 12/26/2014, SpO2 100 %.Body mass index is 48.37 kg/(m^2).  General Appearance: Casual  Eye Contact::  Fair  Speech:  Normal Rate  Volume:  Normal  Mood:  Depressed  Affect:  Congruent  Thought Process:  Coherent  Orientation:  Full (Time, Place, and Person)  Thought Content:  WDL  Suicidal Thoughts:  No  Homicidal Thoughts:  No  Memory:  Immediate;   Good Recent;   Good Remote;   Good  Judgement:  Good  Insight:  Good  Psychomotor Activity:  Normal  Concentration:  Good  Recall:  Good  Fund of Knowledge:Good  Language: Good  Akathisia:  No  Handed:  Right  AIMS (if indicated):     Assets:  Catering manager Housing Physical Health Resilience Social Support Vocational/Educational  ADL's:  Intact  Cognition: WNL  Sleep:      Medical Decision Making: Review of Psycho-Social Stressors (1), Review or order clinical lab tests (1) and Review of Medication Regimen & Side Effects (2)  Treatment Plan Summary: See below  Plan:  No evidence of imminent risk to self or others at present.   Patient does not meet criteria for psychiatric inpatient admission. Supportive therapy provided about ongoing stressors. Refer to IOP. Discussed crisis plan, support from social network, calling 911, coming to the Emergency Department, and calling Suicide Hotline.  Disposition:  -  Discharge home with followup with Dr. Doyne Keel (advocate with OP downstairs for sooner appointment if possible) -Have pt sign no-harm contract.   Benjamine Mola, FNP-BC 01/03/2015 12:40 PM

## 2015-01-03 NOTE — [facility] Counselor (Signed)
Counselor spoke with pt concerning her admission to the hospital. Pt explained that she was overwhelmed by several life stressors including finances and other things. Pt reported that she currently sees a psychiatrist and a therapist. Pt denies SI/HI and AVH. Counselor processed with pt on ways to combat her triggers of stress. Pt was receptive to the information provided to her. Pt shared that she is interested in participating in [facility] IOP and requested information. Counselor provided emotional support and encouragement to pt. Counselor will monitor closely and evaluate for stabilization. ?  ?  Ardelle Park, MA OBS Counselor

## 2015-01-03 NOTE — Telephone Encounter (Signed)
Discussed patient's status with Dr. Michae Kava and called observation unit to make sure patient will be set up for [facility] upon discharge.  Rodman Key, RN and Marcelle Smiling reported they had spoken with Jeri Modena from IOP to schedule for start date on 01/10/15.  Patient to be discharged today from Observation unit and will call as needed.

## 2015-01-03 NOTE — Telephone Encounter (Signed)
Medication management - Message received from Janice Coffin, counselor who works in obs to report patient was admitted there late afternoon on 01/02/15.  Patient remains there currently with no SI/HI but came in due to "life stressors", finances and getting married soon.  Observation unit staff and patient wanted to make sure Dr. Michae Kava and Boneta Lucks, Counselor in outpatient were aware.  Patient is interested in going into [facility] IOP upon discharge.

## 2015-01-03 NOTE — Progress Notes (Addendum)
Pt appears blunted and flat this am. Pt stated ,"I am not so sure about getting married now as my husband is not that ambitious."Pt ate 100% of her breakfast and this am is very talkative. Pt is coloring and appears to be content. Pt stated she has put down a deposit of $1000.00 so she has to go through with the wedding on June 4th. Pt denies Si and HI and contracts for safety. 9a- Pt stated she feels frustrated because her soon to be husband is not working now and she is having to pay all the bills. Pt stated,'I only have one dollar in my account now and even had to go to the food bank." Pt stated,"He always tells me I am nagging when I tell him  He needs to help more."Pt ate 100% of her lunch but still states he feels a little depressed as this is around the time of her two siblings birthdays that died. Pt does contract for safety. 2p-pt was given back all her belongings and all discharge instructions were reviewed with the pt. Pt will be taken to the [facility] parking lot by security to get her car. Pt denies Si and HI and contracts for safety.She stated to the writer,"I have a follow up therapy appointment next Thursday. "

## 2015-01-03 NOTE — H&P (Signed)
[facility] OBS UNIT H&P  Patient Identification: Carla Lawson MRN:  [id number] Principal Diagnosis: Depression, major, recurrent, moderate Diagnosis:   Patient Active Problem List   Diagnosis Date Noted  . Depression, major, recurrent, moderate [F33.1] 01/02/2015  . Depression, major, single episode, moderate [F32.1] 01/02/2015  . Suicidal ideation [R45.851]   . White matter abnormality on MRI of brain [R93.0] 12/24/2014  . Major depressive disorder, recurrent, severe without psychotic features [F33.2] 06/21/2014  . GAD (generalized anxiety disorder) [F41.1] 06/21/2014  . Ingestion of toxin [T65.91XA] 04/22/2014  . GERD without esophagitis [K21.9] 04/22/2014  . Asthma [J45.909] 04/22/2014  . Suicidal intent [R45.851] 04/22/2014  . SYNCOPE AND COLLAPSE [R55] 04/03/2009  . PALPITATIONS [R00.2] 04/03/2009  . CHEST PAIN, ATYPICAL [R07.89] 04/03/2009  . HYPERCHOLESTEROLEMIA [E78.0] 04/02/2009  . 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 female patient admitted to [facility] Observation Unit for stabilization. Pt spent the night in the [facility] OBS without incident. Pt denies suicidal/homicidal ideation and hallucinations. She is able to contract for safety. Pt is seen outpatient by Dr. Doyne Keel and reports a good therapeutic relationship. She would like to continue to followup with her upon discharge from the OBS Unit.   HPI:  Carla Lawson is an 39 y.o. female with depression and anxiety. She presents to [facility] stating that her medications for depression are not working. Patient's outpatient psychiatrist is Dr. Darrall Dears and therapist is Carla Lawson is Carla Lawson. Patient reports suicidal a little over 1 week. No suicidal plan. She tried to commit suicide 1x in past by ingesting a chemical (July 2015). Patient was admitted to [facility] for that suicide attempt. Current suicidal trigger includes a upcoming  wedding scheduled for June 2016, hair falling out, and easily irritated. Patient denies HI and AVH's. No alcohol and drug use.   HPI Elements:   Location:  Psychiatric. Quality:  Improving. Severity:  mild to moderate. Timing:  intermittent. Duration:  Transient Context:  Family dynamic strain  Past Medical History:  Past Medical History  Diagnosis Date  . Asthma   . S/P ablation of ventricular arrhythmia   . Anxiety   . Depression   . Brain lesion     Past Surgical History  Procedure Laterality Date  . Cardiac electrophysiology mapping and ablation    . Cesarean section    . Esophagogastroduodenoscopy N/A 04/23/2014    Procedure: ESOPHAGOGASTRODUODENOSCOPY (EGD);  Surgeon: Ladene Artist, MD;  Location: [facility];  Service: Endoscopy;  Laterality: N/A;   Family History:  Family History  Problem Relation Age of Onset  . Diabetes Father   . Hypertension Father   . Diabetes Brother   . Heart disease Brother   . Drug abuse Brother   . Depression Brother   . Suicidality Brother   . Heart disease Maternal Grandmother   . Cancer Paternal Grandmother   . Diabetes Paternal Grandmother    Social History:  History  Alcohol Use No    Comment: occasionally- a few times a year     History  Drug Use No    History   Social History  . Marital Status: Single    Spouse Name: N/A  . Number of Children: N/A  . Years of Education: N/A   Social History Main Topics  . Smoking status: Never Smoker   . Smokeless tobacco: Never Used  . Alcohol Use: No     Comment:  occasionally- a few times a year  . Drug Use: No  . Sexual Activity:    Partners: Male    Birth Control/ Protection: None   Other Topics Concern  . None   Social History Narrative   Additional Social History:    Pain Medications: denies Prescriptions: denies Over the Counter: denies History of alcohol / drug use?: No history of alcohol / drug abuse                     Allergies:  No Known  Allergies  Labs:  Results for orders placed or performed during the hospital encounter of 01/01/15 (from the past 48 hour(s))  Acetaminophen level     Status: Abnormal   Collection Time: 01/01/15  4:36 PM  Result Value Ref Range   Acetaminophen (Tylenol), Serum <10.0 (L) 10 - 30 ug/mL    Comment:        THERAPEUTIC CONCENTRATIONS VARY SIGNIFICANTLY. A RANGE OF 10-30 ug/mL MAY BE AN EFFECTIVE CONCENTRATION FOR MANY PATIENTS. HOWEVER, SOME ARE BEST TREATED AT CONCENTRATIONS OUTSIDE THIS RANGE. ACETAMINOPHEN CONCENTRATIONS >150 ug/mL AT 4 HOURS AFTER INGESTION AND >50 ug/mL AT 12 HOURS AFTER INGESTION ARE OFTEN ASSOCIATED WITH TOXIC REACTIONS.   CBC     Status: Abnormal   Collection Time: 01/01/15  4:36 PM  Result Value Ref Range   WBC 9.9 4.0 - 10.5 K/uL   RBC 4.83 3.87 - 5.11 MIL/uL   Hemoglobin 11.7 (L) 12.0 - 15.0 g/dL   HCT 37.1 36.0 - 46.0 %   MCV 76.8 (L) 78.0 - 100.0 fL   MCH 24.2 (L) 26.0 - 34.0 pg   MCHC 31.5 30.0 - 36.0 g/dL   RDW 14.0 11.5 - 15.5 %   Platelets 275 150 - 400 K/uL  Comprehensive metabolic panel     Status: Abnormal   Collection Time: 01/01/15  4:36 PM  Result Value Ref Range   Sodium 141 135 - 145 mmol/L   Potassium 3.9 3.5 - 5.1 mmol/L   Chloride 106 96 - 112 mmol/L   CO2 27 19 - 32 mmol/L   Glucose, Bld 119 (H) 70 - 99 mg/dL   BUN 7 6 - 23 mg/dL   Creatinine, Ser 0.89 0.50 - 1.10 mg/dL   Calcium 9.1 8.4 - 10.5 mg/dL   Total Protein 7.9 6.0 - 8.3 g/dL   Albumin 4.0 3.5 - 5.2 g/dL   AST 19 0 - 37 U/L   ALT 15 0 - 35 U/L   Alkaline Phosphatase 74 39 - 117 U/L   Total Bilirubin 0.4 0.3 - 1.2 mg/dL   GFR calc non Af Amer 80 (L) >90 mL/min   GFR calc Af Amer >90 >90 mL/min    Comment: (NOTE) The eGFR has been calculated using the CKD EPI equation. This calculation has not been validated in all clinical situations. eGFR's persistently <90 mL/min signify possible Chronic Kidney Disease.    Anion gap 8 5 - 15  Ethanol (ETOH)     Status:  None   Collection Time: 01/01/15  4:36 PM  Result Value Ref Range   Alcohol, Ethyl (B) <5 0 - 9 mg/dL    Comment:        LOWEST DETECTABLE LIMIT FOR SERUM ALCOHOL IS 11 mg/dL FOR MEDICAL PURPOSES ONLY   Salicylate level     Status: None   Collection Time: 01/01/15  4:36 PM  Result Value Ref Range   Salicylate Lvl <8.7 2.8 - 20.0 mg/dL  POC Urine Pregnancy, ED (do NOT order at [facility])     Status: None   Collection Time: 01/01/15  6:38 PM  Result Value Ref Range   Preg Test, Ur NEGATIVE NEGATIVE    Comment:        THE SENSITIVITY OF THIS METHODOLOGY IS >24 mIU/mL     Vitals: Blood pressure 102/70, pulse 92, temperature 98.7 F (37.1 C), temperature source Oral, resp. rate 20, height 5' 3"  (1.6 m), weight 123.832 kg (273 lb), last menstrual period 12/26/2014, SpO2 100 %.  Risk to Self: Is patient at risk for suicide?: No Risk to Others:   Prior Inpatient Therapy:   Prior Outpatient Therapy:    Current Facility-Administered Medications  Medication Dose Route Frequency Provider Last Rate Last Dose  . albuterol (PROVENTIL HFA;VENTOLIN HFA) 108 (90 BASE) MCG/ACT inhaler 2 puff  2 puff Inhalation Q6H PRN Patrecia Pour, NP      . cholecalciferol (VITAMIN D) tablet 400 Units  400 Units Oral Weekly Patrecia Pour, NP      . escitalopram (LEXAPRO) tablet 20 mg  20 mg Oral Daily Patrecia Pour, NP   20 mg at 01/03/15 4627  . ibuprofen (ADVIL,MOTRIN) tablet 600 mg  600 mg Oral Q8H PRN Patrecia Pour, NP      . loratadine (CLARITIN) tablet 10 mg  10 mg Oral Daily PRN Patrecia Pour, NP      . LORazepam (ATIVAN) tablet 1 mg  1 mg Oral Q8H PRN Patrecia Pour, NP      . meclizine (ANTIVERT) tablet 25 mg  25 mg Oral TID PRN Patrecia Pour, NP      . zolpidem (AMBIEN) tablet 5 mg  5 mg Oral QHS PRN Patrecia Pour, NP   5 mg at 01/02/15 2304    Musculoskeletal: Strength & Muscle Tone: within normal limits Gait & Station: normal Patient leans: N/A  Psychiatric Specialty Exam:     Blood  pressure 102/70, pulse 92, temperature 98.7 F (37.1 C), temperature source Oral, resp. rate 20, height 5' 3"  (1.6 m), weight 123.832 kg (273 lb), last menstrual period 12/26/2014, SpO2 100 %.Body mass index is 48.37 kg/(m^2).  General Appearance: Casual  Eye Contact::  Fair  Speech:  Normal Rate  Volume:  Normal  Mood:  Depressed  Affect:  Congruent  Thought Process:  Coherent  Orientation:  Full (Time, Place, and Person)  Thought Content:  WDL  Suicidal Thoughts:  No  Homicidal Thoughts:  No  Memory:  Immediate;   Good Recent;   Good Remote;   Good  Judgement:  Good  Insight:  Good  Psychomotor Activity:  Normal  Concentration:  Good  Recall:  Good  Fund of Knowledge:Good  Language: Good  Akathisia:  No  Handed:  Right  AIMS (if indicated):     Assets:  Catering manager Housing Physical Health Resilience Social Support Vocational/Educational  ADL's:  Intact  Cognition: WNL  Sleep:      Medical Decision Making: Review of Psycho-Social Stressors (1), Review or order clinical lab tests (1) and Review of Medication Regimen & Side Effects (2)  Treatment Plan Summary: See below  Plan:  No evidence of imminent risk to self or others at present.   Patient does not meet criteria for psychiatric inpatient admission. Supportive therapy provided about ongoing stressors. Refer to IOP. Discussed crisis plan, support from social network, calling 911, coming to the Emergency Department, and calling Suicide Hotline.  Disposition:  -Discharge  home with followup with Dr. Doyne Keel (advocate with OP downstairs for sooner appointment if possible) -Have pt sign no-harm contract.   Benjamine Mola, FNP-BC 01/03/2015 9:11 AM

## 2015-01-03 NOTE — Discharge Instructions (Signed)
You have been accepted to attend the Mental Health Intensive Outpatient Therapy Program (IOP) at the [facility]. You are to report for your first session on Thursday, Jan 10, 2015 at 830 am. Below is the contact information for the IOP program.   [facility] [address] [place], [place] [address] [phone]

## 2015-01-10 ENCOUNTER — Encounter (HOSPITAL_COMMUNITY): Payer: Self-pay | Admitting: Psychiatry

## 2015-01-10 ENCOUNTER — Other Ambulatory Visit (HOSPITAL_COMMUNITY): Payer: BLUE CROSS/BLUE SHIELD | Attending: Psychiatry | Admitting: Psychiatry

## 2015-01-10 DIAGNOSIS — F331 Major depressive disorder, recurrent, moderate: Secondary | ICD-10-CM

## 2015-01-10 DIAGNOSIS — F419 Anxiety disorder, unspecified: Secondary | ICD-10-CM | POA: Diagnosis not present

## 2015-01-10 DIAGNOSIS — R45851 Suicidal ideations: Secondary | ICD-10-CM | POA: Insufficient documentation

## 2015-01-10 DIAGNOSIS — F332 Major depressive disorder, recurrent severe without psychotic features: Secondary | ICD-10-CM | POA: Diagnosis not present

## 2015-01-10 DIAGNOSIS — J45909 Unspecified asthma, uncomplicated: Secondary | ICD-10-CM | POA: Insufficient documentation

## 2015-01-10 NOTE — Progress Notes (Signed)
Psychiatric Assessment Adult  Patient Identification:  Carla Lawson Date of Evaluation:  01/10/2015 Chief Complaint: depression with suicidal thoughts History of Chief Complaint:  Ms Carla Lawson says she has been depressed for years but things have gotten worse in the past couple of months.  She has been experiencing dizzy spells that prevent her from working.  The doctors have found no cause for the spells yet.  Consequently she has not worked since the beginning of April.  Unfortunately her fiance was also laid off at the same time so finances have become the major issue.  She is behind on her rent and no resources to help her out.  She owes on the pending wedding bills as well.  Family can no longer help her.  She has gotten more depressed and started having suicidal thoughts again and said she needed to come for help to head it off before it go worse.  The group therapy helped in the past. Her son has Asperger's disorder and is currently having problems at school and that stresses her.  She is marrying her fiance in June and that is a stress of a different nature. This is the anniversary of her brother's murder in 2014 and that has been on her mind.  HPI Review of Systems Physical Exam  Depressive Symptoms: depressed mood, anhedonia, insomnia, fatigue, difficulty concentrating, suicidal thoughts without plan, anxiety,  (Hypo) Manic Symptoms:   Elevated Mood:  Negative Irritable Mood:  Yes Grandiosity:  Negative Distractibility:  Negative Labiality of Mood:  Yes Delusions:  Negative Hallucinations:  Negative Impulsivity:  Yes Sexually Inappropriate Behavior:  Negative Financial Extravagance:  Yes Flight of Ideas:  Negative  Anxiety Symptoms: Excessive Worry:  Yes Panic Symptoms:  Negative Agoraphobia:  Negative Obsessive Compulsive: Negative  Symptoms: None, Specific Phobias:  Negative Social Anxiety:  Negative  Psychotic Symptoms:  Hallucinations: Negative  None Delusions:  Negative Paranoia:  Negative   Ideas of Reference:  Negative  PTSD Symptoms: Ever had a traumatic exposure:  Negative Had a traumatic exposure in the last month:  Negative Re-experiencing: Negative None Hypervigilance:  Negative Hyperarousal: Negative None Avoidance: Negative None  Traumatic Brain Injury: Negative na  Past Psychiatric History: Diagnosis: major depression  Hospitalizations: 2  Outpatient Care: none  Substance Abuse Care: none  Self-Mutilation: none  Suicidal Attempts:one  Violent Behaviors: none   Past Medical History:   Past Medical History  Diagnosis Date  . Asthma   . S/P ablation of ventricular arrhythmia   . Anxiety   . Depression   . Brain lesion    History of Loss of Consciousness:  Negative Seizure History:  Negative Cardiac History:  Negative Allergies:  No Known Allergies Current Medications:  Current Outpatient Prescriptions  Medication Sig Dispense Refill  . albuterol (PROVENTIL HFA;VENTOLIN HFA) 108 (90 BASE) MCG/ACT inhaler Inhale 2 puffs into the lungs every 6 (six) hours as needed for wheezing or shortness of breath. 1 Inhaler 2  . cholecalciferol (VITAMIN D) 400 UNITS TABS tablet Take 1 tablet (400 Units total) by mouth once a week. 30 each 0  . escitalopram (LEXAPRO) 20 MG tablet Take 1 tablet (20 mg total) by mouth daily. 30 tablet 2  . loratadine (CLARITIN) 10 MG tablet Take 10 mg by mouth daily as needed for allergies.    Marland Kitchen meclizine (ANTIVERT) 25 MG tablet Take 1 tablet (25 mg total) by mouth 3 (three) times daily as needed for dizziness. 30 tablet 0   No current facility-administered medications for this  visit.    Previous Psychotropic Medications:  Medication Dose   see above list  na                     Substance Abuse History in the last 12 months:none none                                                                                                  Medical Consequences of  Substance Abuse: none  Legal Consequences of Substance Abuse: none  Family Consequences of Substance Abuse: none  Blackouts:  Negative DT's:  Negative Withdrawal Symptoms:  Negative None  Social History: Current Place of Residence: [place] Place of Birth: [place] Family Members: has relationship with family members but not that close Marital Status:  Single Children: 1  Sons: 1  Daughters: 0 Relationships: close to fiance Education:  HS Graduate Educational Problems/Performance: good Religious Beliefs/Practices: none reported History of Abuse: none Occupational Experiences; Military History:  None. Legal History: none Hobbies/Interests: none reported  Family History:   Family History  Problem Relation Age of Onset  . Diabetes Father   . Hypertension Father   . Diabetes Brother   . Heart disease Brother   . Drug abuse Brother   . Depression Brother   . Suicidality Brother   . Heart disease Maternal Grandmother   . Cancer Paternal Grandmother   . Diabetes Paternal Grandmother     Mental Status Examination/Evaluation: Objective:  Appearance: Well Groomed  Patent attorney::  Good  Speech:  Clear and Coherent  Volume:  Normal  Mood:  depressed  Affect:  Appropriate  Thought Process:  appropriate  Orientation:  Full (Time, Place, and Person)  Thought Content:  Negative  Suicidal Thoughts:  Yes.  without intent/plan  Homicidal Thoughts:  No  Judgement:  Fair  Insight:  Fair  Psychomotor Activity:  Normal  Akathisia:  Negative  Handed:  Right  AIMS (if indicated):  0  Assets:  Communication Skills Desire for Improvement Housing Intimacy Physical Health Resilience Social Support Talents/Skills Transportation Vocational/Educational    Laboratory/X-Ray Psychological Evaluation(s)   none  none   Assessment:  Major depression, recurrent, severe without psychotic features                  Treatment Plan/Recommendations:  Plan of Care: daily group therapy   Laboratory:  na  Psychotherapy: group therapy  Medications: continue current meds  Routine PRN Medications:  Yes  Consultations: none  Safety Concerns:  none  Other:      Benjaman Pott, MD 5/5/201611:25 AM
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
## 2015-01-10 NOTE — Progress Notes (Signed)
    Daily Group Progress Note  Program: IOP  Group Time: 9:00-10:30  Participation Level: Active  Behavioral Response: Appropriate  Type of Therapy:  Group Therapy  Summary of Progress: Pt. Met with psychiatrist and case manager.      Group Time: 10:30-12:00  Participation Level:  Active  Behavioral Response: Appropriate  Type of Therapy: Psycho-education Group  Summary of Progress: Pt. Participated in discussion about RAIN method of meditation and regular use in order to build emotional and distress tolerance.   Nancie Neas, LPC

## 2015-01-11 ENCOUNTER — Other Ambulatory Visit (HOSPITAL_COMMUNITY): Payer: BLUE CROSS/BLUE SHIELD | Admitting: Psychiatry

## 2015-01-11 DIAGNOSIS — F332 Major depressive disorder, recurrent severe without psychotic features: Secondary | ICD-10-CM | POA: Diagnosis not present

## 2015-01-11 NOTE — Progress Notes (Signed)
    Daily Group Progress Note  Program: IOP  Group Time: 9:00-10:30  Participation Level: Active  Behavioral Response: Appropriate  Type of Therapy:  Group Therapy  Summary of Progress: Pt. Presented as talkative. Pt. Reported that the spring is difficult for her because she feels more vulnerable when she is not able to cover herself. Pt. Discussed wedding planning and tension between family members that are causing fear about family getting along on her wedding day.     Group Time: 10:30-12:00  Participation Level:  Active  Behavioral Response: Appropriate  Type of Therapy: Psycho-education Group  Summary of Progress: Pt. Participated in continued discussion of the RAIN method of meditation and participated in self-acceptance guided meditation.   Shaune Pollack, LPC

## 2015-01-14 ENCOUNTER — Other Ambulatory Visit (HOSPITAL_COMMUNITY): Payer: BLUE CROSS/BLUE SHIELD | Admitting: Psychiatry

## 2015-01-15 ENCOUNTER — Other Ambulatory Visit (HOSPITAL_COMMUNITY): Payer: BLUE CROSS/BLUE SHIELD | Admitting: Psychiatry

## 2015-01-15 DIAGNOSIS — F331 Major depressive disorder, recurrent, moderate: Secondary | ICD-10-CM

## 2015-01-15 NOTE — Progress Notes (Signed)
    Daily Group Progress Note  Program: IOP  Group Time: 1100-1200  Participation Level: Active  Behavioral Response: Appropriate and Sharing  Type of Therapy:  Psycho-education Group  Summary of Progress: Patient participated in various movement exercises. Discussed ten relaxation techniques that will help with stress. Also, listened to a CD and applied focused breathing and progressive relaxation.

## 2015-01-16 ENCOUNTER — Other Ambulatory Visit (HOSPITAL_COMMUNITY): Payer: BLUE CROSS/BLUE SHIELD | Admitting: Psychiatry

## 2015-01-16 DIAGNOSIS — F331 Major depressive disorder, recurrent, moderate: Secondary | ICD-10-CM

## 2015-01-16 DIAGNOSIS — F332 Major depressive disorder, recurrent severe without psychotic features: Secondary | ICD-10-CM | POA: Diagnosis not present

## 2015-01-16 NOTE — Progress Notes (Signed)
    Daily Group Progress Note  Program: IOP  Group Time: 9:00-10:30  Participation Level: Active  Behavioral Response: Appropriate  Type of Therapy:  Group Therapy  Summary of Progress: Pt. Presented with brightened affect compared to last group attendance. Pt continues to report conflict with family and extended family, and difficulty setting emotional boundaries.      Group Time: 10:30-12:00  Participation Level:  Active  Behavioral Response: Appropriate  Type of Therapy: Psycho-education Group  Summary of Progress: Pt. Participated in group about developing healthy relationship boundaries. Pt. Watched [employer] video on boundaries, blame, and vulnerability.  Shaune Pollack, LPC

## 2015-01-17 ENCOUNTER — Other Ambulatory Visit (HOSPITAL_COMMUNITY): Payer: BLUE CROSS/BLUE SHIELD | Admitting: Psychiatry

## 2015-01-17 DIAGNOSIS — F331 Major depressive disorder, recurrent, moderate: Secondary | ICD-10-CM

## 2015-01-17 DIAGNOSIS — F332 Major depressive disorder, recurrent severe without psychotic features: Secondary | ICD-10-CM | POA: Diagnosis not present

## 2015-01-18 ENCOUNTER — Other Ambulatory Visit (HOSPITAL_COMMUNITY): Payer: BLUE CROSS/BLUE SHIELD | Admitting: Psychiatry

## 2015-01-18 DIAGNOSIS — F331 Major depressive disorder, recurrent, moderate: Secondary | ICD-10-CM

## 2015-01-18 DIAGNOSIS — F332 Major depressive disorder, recurrent severe without psychotic features: Secondary | ICD-10-CM | POA: Diagnosis not present

## 2015-01-18 NOTE — Progress Notes (Signed)
    Daily Group Progress Note  Program: IOP  Group Time: 9:00-10:30  Participation Level: Active  Behavioral Response: Appropriate  Type of Therapy:  Psycho-education Group  Summary of Progress: Pt. Participated in instruction about use of meditation for emotion regulation and breath focused meditation exercise.      Group Time: 10:30-12:00  Participation Level:  Active  Behavioral Response: Appropriate  Type of Therapy: Group Therapy  Summary of Progress: Pt. Continues to present depressed with flat affect. Pt. Reports that she feels "sad, angry, frustrated" all at once. Pt. Continues to be stressed by wedding plans and family members who are not helpful or supportive. Pt. Reported plans to spend time with a friend this weekend who has a dog that provides emotional support for her and with her young nephew.  Shaune Pollack, LPC

## 2015-01-18 NOTE — Progress Notes (Signed)
    Daily Group Progress Note  Program: IOP  Group Time: 9:00-10:30  Participation Level: Active  Behavioral Response: Appropriate  Type of Therapy:  Group Therapy  Summary of Progress: Pt. Presented as depressed, reported that she felt "ok". Pt. Reports ongoing frustration with family members regarding wedding planning. Pt. Does not feel supported by immediate family, finding more support with step-mother and her extended family.      Group Time: 10:30-12:00  Participation Level:  Active  Behavioral Response: Appropriate  Type of Therapy: Psycho-education Group  Summary of Progress: Pt. Participated in discussion about development of self-compassion. Pt. Was attentive during discussion of Clovia Cuff video about development of self-compassion through mindfulness, kindness to self, and recognition of common humanity.   Shaune Pollack, LPC

## 2015-01-21 ENCOUNTER — Other Ambulatory Visit (HOSPITAL_COMMUNITY): Payer: BLUE CROSS/BLUE SHIELD | Admitting: Psychiatry

## 2015-01-21 DIAGNOSIS — F331 Major depressive disorder, recurrent, moderate: Secondary | ICD-10-CM

## 2015-01-21 DIAGNOSIS — F332 Major depressive disorder, recurrent severe without psychotic features: Secondary | ICD-10-CM | POA: Diagnosis not present

## 2015-01-21 NOTE — Progress Notes (Signed)
    Daily Group Progress Note  Program: IOP  Group Time: 9:00-10:30  Participation Level: Active  Behavioral Response: Appropriate  Type of Therapy:  Psycho-education Group  Summary of Progress: Pt. Participated in medication education group facilitated by [facility].     Group Time: 10:30-12:00  Participation Level:  Active  Behavioral Response: Appropriate  Type of Therapy: Group Therapy  Summary of Progress: Pt. Shared that she was feeling "ok", but she was feeling tired. Pt. Participated in group, identifying with anxiety. Pt. Reported that she was able to soothe her anxiety over the weekend by cleaning her home and organizing her cabinets.   Shaune Pollack, LPC

## 2015-01-22 ENCOUNTER — Other Ambulatory Visit (HOSPITAL_COMMUNITY): Payer: BLUE CROSS/BLUE SHIELD | Admitting: Psychiatry

## 2015-01-22 DIAGNOSIS — F332 Major depressive disorder, recurrent severe without psychotic features: Secondary | ICD-10-CM | POA: Diagnosis not present

## 2015-01-22 NOTE — Progress Notes (Signed)
    Daily Group Progress Note  Program: IOP  Group Time: 0900-1045  Participation Level: Active  Behavioral Response: Appropriate and Monopolizing  Type of Therapy:  Group Therapy  Summary of Progress:  Pt was very talkative in the group today.  Voiced the stressors of her family and the upcoming wedding.  Pt mentioned that she was sad today due to death in her family.  According to pt, her uncle, who was visiting his daughter and new grandchild, died suddenly yesterday.     Group Time: 1100-1200  Participation Level:  Active  Behavioral Response: Appropriate and Monopolizing  Type of Therapy: Psycho-education Group  Summary of Progress: Alyse from [facility] provided patient with information about mental health services and educated patient about maintaining wellness, coping with stress and managing symptoms.

## 2015-01-23 ENCOUNTER — Other Ambulatory Visit (HOSPITAL_COMMUNITY): Payer: BLUE CROSS/BLUE SHIELD | Admitting: Psychiatry

## 2015-01-23 DIAGNOSIS — F331 Major depressive disorder, recurrent, moderate: Secondary | ICD-10-CM

## 2015-01-23 DIAGNOSIS — F332 Major depressive disorder, recurrent severe without psychotic features: Secondary | ICD-10-CM | POA: Diagnosis not present

## 2015-01-23 NOTE — Progress Notes (Signed)
    Daily Group Progress Note  Program: IOP  Group Time: 9:00-10:30  Participation Level: Active  Behavioral Response: Appropriate  Type of Therapy:  Psycho-education Group  Summary of Progress:  Pt. Participated in discussion about development of self-care behaviors. Pt. Discussed sheet "100 ways to self-care".     Group Time: 10:30-12:00  Participation Level:  Active  Behavioral Response: Appropriate  Type of Therapy: Group Therapy  Summary of Progress: Pt. Presented as talkative, depressed, appropriately tearful. Pt. Shared with the group that she received information that she had been evicted from her apartment during group. Pt. Reported to the group that due to her housing problems that she and her fiance decided to postpone her wedding. Pt. Was sad because she was not sure where she and her son and fiance would be living.   Shaune Pollack, LPC

## 2015-01-24 ENCOUNTER — Other Ambulatory Visit (HOSPITAL_COMMUNITY): Payer: BLUE CROSS/BLUE SHIELD | Admitting: Psychiatry

## 2015-01-24 DIAGNOSIS — F331 Major depressive disorder, recurrent, moderate: Secondary | ICD-10-CM

## 2015-01-24 DIAGNOSIS — F332 Major depressive disorder, recurrent severe without psychotic features: Secondary | ICD-10-CM | POA: Diagnosis not present
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
## 2015-01-24 NOTE — Patient Instructions (Signed)
Patient completed MH-IOP today.  Will follow up with Dr. Michae Kava on 02-05-15 @ 1130 and Boneta Lucks, [facility] on 04-17-15 @ 1:30 pm.  Encouraged support groups.  Pt plans to attend groups at [facility].

## 2015-01-24 NOTE — Progress Notes (Signed)
Patient ID: BRENLEY PRIORE, female   DOB: December 02, 1975, 39 y.o.   MRN: [id number] Discharge Note  Patient:  Carla Lawson is an 39 y.o., female DOB:  05/14/1976  Date of Admission:  01/10/2015  Date of Discharge:  01/24/2015  Reason for Admission:depression and anxiety  IOP Course:  Carla Lawson did well in the program saying it was helpful.  She was evicted from her apartment the day before discharge and said being here was helpful in dealing with that.  Her fiance has a good job and her short term disability was approved so that the outlook is more positive than it sounds.  She will follow up with [facility] 6 week [facility] as well as with her psychiatrist and therapist.  Mental Status at Discharge:depression is decreased,anxiety is under control.  No suicidal ideation  Lab Results: No results found for this or any previous visit (from the past 48 hour(s)).   Current outpatient prescriptions:  .  albuterol (PROVENTIL HFA;VENTOLIN HFA) 108 (90 BASE) MCG/ACT inhaler, Inhale 2 puffs into the lungs every 6 (six) hours as needed for wheezing or shortness of breath., Disp: 1 Inhaler, Rfl: 2 .  cholecalciferol (VITAMIN D) 400 UNITS TABS tablet, Take 1 tablet (400 Units total) by mouth once a week., Disp: 30 each, Rfl: 0 .  escitalopram (LEXAPRO) 20 MG tablet, Take 1 tablet (20 mg total) by mouth daily., Disp: 30 tablet, Rfl: 2 .  loratadine (CLARITIN) 10 MG tablet, Take 10 mg by mouth daily as needed for allergies., Disp: , Rfl:  .  meclizine (ANTIVERT) 25 MG tablet, Take 1 tablet (25 mg total) by mouth 3 (three) times daily as needed for dizziness., Disp: 30 tablet, Rfl: 0  Axis Diagnosis:  Major depression, recurrent severe without psychosis   Level of Care:  IOP  Discharge destination:  Other:  follow up with psychiatrist and therapist  Is patient on multiple antipsychotic therapies at discharge:  No    Has Patient had three or more failed trials of  antipsychotic monotherapy by history:  Negative  Patient phone:  [phone] (home)  Patient address:   [address] [place] [address],   Follow-up recommendations:  Continue current activity and diet  Comments:  Carla Lawson is optimistic that things will continue improving for her at the time of discharge   The patient received suicide prevention pamphlet:  Negative Belongings returned:  na TAYLOR,GERALD D 01/24/2015, 11:07 AM

## 2015-01-25 ENCOUNTER — Other Ambulatory Visit (HOSPITAL_COMMUNITY): Payer: BLUE CROSS/BLUE SHIELD | Admitting: Psychiatry

## 2015-01-25 NOTE — Psych (Signed)
    Daily Group Progress Note  Program: IOP  Group Time: 9:00-10:30  Participation Level: Active  Behavioral Response: Appropriate  Type of Therapy:  Group Therapy  Summary of Progress: Pt. Presented with brightened mood compared to yesterday's group. Pt. Reported that she was able to live with her sister for the short-term and her mother had been supportive. Pt. Reported that she was making peace with having to postpone her wedding. Pt. Participated in discharge planning and received positive feedback from the group about her courage and strength in difficult times.       Group Time: 10:30-12:00  Participation Level:  Active  Behavioral Response: Appropriate  Type of Therapy: Psycho-education Group  Summary of Progress: Pt. Participated in reflection and meditation about self-acceptance.   Shaune Pollack, LPC

## 2015-01-28 ENCOUNTER — Other Ambulatory Visit (HOSPITAL_COMMUNITY): Payer: BLUE CROSS/BLUE SHIELD

## 2015-01-29 ENCOUNTER — Other Ambulatory Visit (HOSPITAL_COMMUNITY): Payer: BLUE CROSS/BLUE SHIELD

## 2015-01-30 ENCOUNTER — Other Ambulatory Visit (HOSPITAL_COMMUNITY): Payer: BLUE CROSS/BLUE SHIELD

## 2015-01-31 ENCOUNTER — Other Ambulatory Visit (HOSPITAL_COMMUNITY): Payer: BLUE CROSS/BLUE SHIELD

## 2015-02-05 ENCOUNTER — Ambulatory Visit (HOSPITAL_COMMUNITY): Payer: Self-pay | Admitting: Psychiatry

## 2015-02-07 ENCOUNTER — Telehealth (HOSPITAL_COMMUNITY): Payer: Self-pay

## 2015-02-07 ENCOUNTER — Ambulatory Visit (HOSPITAL_COMMUNITY): Payer: Self-pay | Admitting: Psychiatry

## 2015-02-12 ENCOUNTER — Encounter (HOSPITAL_COMMUNITY): Payer: Self-pay | Admitting: Psychiatry

## 2015-02-14 ENCOUNTER — Ambulatory Visit (HOSPITAL_COMMUNITY): Admission: RE | Admit: 2015-02-14 | Payer: BLUE CROSS/BLUE SHIELD | Source: Ambulatory Visit

## 2015-02-19 ENCOUNTER — Telehealth (HOSPITAL_COMMUNITY): Payer: Self-pay

## 2015-02-19 NOTE — Telephone Encounter (Signed)
Met with Dr. Doyne Keel who agreed to rewrite patient's letter to return to work with return date set for 02/20/15.  Dr. Doyne Keel signed letter.  Called patient back at number provided [phone] and their message box was not set up to take a message and stated person was unavailable at this time.  Attempted to reach several times this afternoon with no success.  Called patient's other number provided at [phone] and kept getting a message patient did not have a voice message set up to use to leave a message for patient.  Letter left for patient at the front desk for pick up.

## 2015-02-19 NOTE — Telephone Encounter (Signed)
Telephone call with patient requesting Dr. Michae Kava rewrite a letter for her work following previous note that was due to them with her return date for [facility].  Patient stated she just learned that Dr. Michae Kava prepared the letter she requested previously but no one let her know it was ready for pick up so when she got it yesterday, she realized it was dated for her to return on 02/13/15 and now it would look like "a no call, no show".  Patient requested Dr. Michae Kava rewrite the letter, stating she had no idea it was ready earlier to cover her missed days as she was not aware she was supposed to be back at work on 02/13/15.  Agreed to send the request to Dr. Michae Kava but could not assure patient this would be acceptable.

## 2015-02-20 ENCOUNTER — Ambulatory Visit: Payer: Self-pay | Admitting: Neurology

## 2015-02-20 NOTE — Telephone Encounter (Signed)
D:  Placed call to pt [phone]) to inquire about the name of her disability company, fax number; along with her claim number.  Pt states the company is Loletta Parish with [employer].  Reports she didn't have the claim number or fax number for them.  A:  Informed pt that this Clinical research associate had a fax number for [employer]; but wasn't sure if it was correct.  Informed pt to call writer back if she found a different number and or claim number.  Faxed the letter.  Informed Shawn, RN.  R:  Pt receptive.

## 2015-02-20 NOTE — Telephone Encounter (Signed)
Telephone call with patient this morning informing Dr. Michae Kava had rewritten her needed letter for work with return date today.  Patient agreed to pick up letter and requested a copy of this letter be sent to her disability claim manager as well.  Agreed to have copy of letter sent and will follow up with Jeri Modena, CM who sent last letter from IOP as patient states she has the contact information who to send a copy of letter to today.  Patient to pick up letter and understands she can now return to work.

## 2015-02-21 ENCOUNTER — Encounter: Payer: Self-pay | Admitting: Neurology

## 2015-02-28 ENCOUNTER — Ambulatory Visit (HOSPITAL_COMMUNITY): Payer: Self-pay | Admitting: Psychiatry

## 2015-03-10 ENCOUNTER — Encounter: Payer: Self-pay | Admitting: Neurology

## 2015-03-14 ENCOUNTER — Ambulatory Visit (HOSPITAL_COMMUNITY): Payer: Self-pay | Admitting: Psychiatry

## 2015-03-20 ENCOUNTER — Ambulatory Visit (INDEPENDENT_AMBULATORY_CARE_PROVIDER_SITE_OTHER): Payer: BLUE CROSS/BLUE SHIELD | Admitting: Psychiatry

## 2015-03-20 ENCOUNTER — Encounter (HOSPITAL_COMMUNITY): Payer: Self-pay | Admitting: Psychiatry

## 2015-03-20 VITALS — BP 118/80 | HR 95 | Ht 63.0 in | Wt 268.8 lb

## 2015-03-20 DIAGNOSIS — F411 Generalized anxiety disorder: Secondary | ICD-10-CM

## 2015-03-20 DIAGNOSIS — F332 Major depressive disorder, recurrent severe without psychotic features: Secondary | ICD-10-CM

## 2015-03-20 MED ORDER — ESCITALOPRAM OXALATE 20 MG PO TABS: 20.0000 mg | ORAL_TABLET | Freq: Every day | ORAL | Status: DC

## 2015-03-20 NOTE — Progress Notes (Signed)
[facility] M.D. progress note  Patient Identification:  Carla Lawson Date of Evaluation:  03/20/2015 Chief Complaint: depression with suicidal thoughts History of present illness 39 year old African-American female seen for the first time by Dr.Miamor Ayler, patient sees Dr. Michae Kava on a regular basis. She was recently discharged from IOP where she had been transferred from the inpatient unit because of depression. She was also experiencing dizzy spells and a medical workup did not reveal any cause.  As a result of missing a lot of work she was fired from the job. Patient has applied for unemployment and is currently staying with Carla Lawson sister. States that Carla Lawson 24 year old son is urinating on the floors and she is worried about it. Encouraged Carla Lawson to take him to the counselor. Patient continues to being gait and plans to marry Carla Lawson fianc soon.  States that Carla Lawson sleep and appetite are good mood is fair feels anxious, denies hopelessness or helplessness denies suicidal or homicidal and has no hallucinations or delusions. She's tolerating Carla Lawson medications well and is coping well.  Marland Kitchen  HPI Review of Systems  Constitutional: Positive for fatigue. Negative for fever, chills, activity change and appetite change.  HENT: Negative for congestion, dental problem and hearing loss.   Eyes: Negative for pain, discharge and visual disturbance.  Respiratory: Negative for cough and chest tightness.   Cardiovascular: Negative for chest pain and palpitations.  Gastrointestinal: Negative for nausea, vomiting, constipation, abdominal distention and anal bleeding.  Endocrine: Negative for polydipsia, polyphagia and polyuria.  Genitourinary: Negative for dysuria, flank pain, vaginal bleeding and difficulty urinating.  Musculoskeletal: Positive for arthralgias.  Skin: Negative for color change and rash.  Allergic/Immunologic: Negative for environmental allergies and food allergies.  Neurological: Negative for dizziness,  seizures, syncope, light-headedness and headaches.  Hematological: Negative for adenopathy. Does not bruise/bleed easily.  Psychiatric/Behavioral: Positive for dysphoric mood. The patient is nervous/anxious.    Physical Exam  Depressive Symptoms: None  (Hypo) Manic Symptoms:  None   Anxiety Symptoms: Excessive rumination   Psychotic Symptoms: None   PTSD Symptoms: None   Traumatic Brain Injury: Negative na  Past Psychiatric History: Diagnosis: major depression  Hospitalizations: 2  Outpatient Care: none  Substance Abuse Care: none  Self-Mutilation: none  Suicidal Attempts:one  Violent Behaviors: none   Past Medical History:   Past Medical History  Diagnosis Date  . Asthma   . S/P ablation of ventricular arrhythmia   . Anxiety   . Depression   . Brain lesion    History of Loss of Consciousness:  Negative Seizure History:  Negative Cardiac History:  Negative Allergies:  No known allergies Current Medications:  Current Outpatient Prescriptions  Medication Sig Dispense Refill  . albuterol (PROVENTIL HFA;VENTOLIN HFA) 108 (90 BASE) MCG/ACT inhaler Inhale 2 puffs into the lungs every 6 (six) hours as needed for wheezing or shortness of breath. 1 Inhaler 2  . cholecalciferol (VITAMIN D) 400 UNITS TABS tablet Take 1 tablet (400 Units total) by mouth once a week. 30 each 0  . escitalopram (LEXAPRO) 20 MG tablet Take 1 tablet (20 mg total) by mouth daily. 30 tablet 2  . loratadine (CLARITIN) 10 MG tablet Take 10 mg by mouth daily as needed for allergies.    Marland Kitchen meclizine (ANTIVERT) 25 MG tablet Take 1 tablet (25 mg total) by mouth 3 (three) times daily as needed for dizziness. 30 tablet 0   No current facility-administered medications for this visit.    Previous Psychotropic Medications:  Medication Dose   see above  list  na                     Substance Abuse History in the last 12 months:none none                                                                                                   Medical Consequences of Substance Abuse: none  Legal Consequences of Substance Abuse: none  Family Consequences of Substance Abuse: none  Blackouts:  Negative DT's:  Negative Withdrawal Symptoms:  Negative None  Social History: Current Place of Residence: [place] Place of Birth: [place] Family Members: has relationship with family members but not that close Marital Status:  Single Children: 1  Sons: 1  Daughters: 0 Relationships: close to fiance Education:  HS Graduate Educational Problems/Performance: good Religious Beliefs/Practices: none reported History of Abuse: none Occupational Experiences; Military History:  None. Legal History: none Hobbies/Interests: none reported  Family History:   Family History  Problem Relation Age of Onset  . Diabetes Father   . Hypertension Father   . Diabetes Brother   . Heart disease Brother   . Drug abuse Brother   . Depression Brother   . Suicidality Brother   . Heart disease Maternal Grandmother   . Cancer Paternal Grandmother   . Diabetes Paternal Grandmother     Mental Status Examination/Evaluation: Objective:  Appearance: Well Groomed  Patent attorney::  Good  Speech:  Clear and Coherent  Volume:  Normal  Mood:  Anxious   Affect:  Constricted  Thought Process:  appropriate  Orientation:  Full (Time, Place, and Person)  Thought Content:  WDL   Suicidal Thoughts:  No   Homicidal Thoughts:  No  Judgement:  Fair  Insight:  Fair  Psychomotor Activity:  Normal  Akathisia:  Negative  Handed:  Right  AIMS (if indicated):  0  Assets:  Communication Skills Desire for Improvement Housing Intimacy Physical Health Resilience Social Support Talents/Skills Transportation Vocational/Educational    Laboratory/X-Ray Psychological Evaluation(s)   none  none   Assessment:  Major depression, recurrent, severe without psychotic features                  Treatment  Plan/Recommendations:  Patient will continue all Carla Lawson medications including Lexapro 20 mg every day. For Carla Lawson depression and anxiety   see Carla Lawson counselor for therapy.   CBT and relaxation exercises for Carla Lawson anxiety.   Return to clinic in 4 months or sooner if necessary.             Margit Banda, MD 7/13/201610:44 AM

## 2015-04-17 ENCOUNTER — Ambulatory Visit (HOSPITAL_COMMUNITY): Payer: Self-pay | Admitting: Psychiatry

## 2015-06-06 ENCOUNTER — Ambulatory Visit (HOSPITAL_COMMUNITY): Payer: Self-pay | Admitting: Psychiatry

## 2015-07-25 ENCOUNTER — Ambulatory Visit (HOSPITAL_COMMUNITY): Payer: Self-pay | Admitting: Psychiatry

## 2015-09-19 ENCOUNTER — Telehealth: Payer: Self-pay

## 2015-09-19 DIAGNOSIS — R9082 White matter disease, unspecified: Secondary | ICD-10-CM

## 2015-09-19 DIAGNOSIS — R531 Weakness: Secondary | ICD-10-CM

## 2015-09-19 NOTE — Telephone Encounter (Signed)
Pt called and asked to have MRI scheduled. States she was supposed to have it awhile back but never did.   Patient was seen on 12/24/14. A&P from that date below:   IMPRESSION: Abnormal white matter on MRI of brain. Non-specific and of uncertain significance. I cannot say there is any connection to her episodes of fatigue and weakness Episodes of diffuse weakness. Vague symptoms Orthostatic hypotension Morbid obesity PLAN: Will repeat MRI of brain with and without contrast. If no changes, then would just monitor clinically and she may follow up as needed. Weight loss  Patient was to follow up in June and no showed. No appointment on file currently. Please advise.

## 2015-09-19 NOTE — Telephone Encounter (Signed)
Okay.  MRI of brain with and without contrast for weakness and abnormal white matter on MRI of brain

## 2015-09-19 NOTE — Telephone Encounter (Addendum)
MRI ordered, scheduled, and authorized. Pt aware.

## 2015-09-25 ENCOUNTER — Inpatient Hospital Stay: Admission: RE | Admit: 2015-09-25 | Payer: Self-pay | Source: Ambulatory Visit

## 2015-10-01 ENCOUNTER — Inpatient Hospital Stay: Admission: RE | Admit: 2015-10-01 | Payer: Self-pay | Source: Ambulatory Visit

## 2015-10-08 ENCOUNTER — Encounter: Payer: Self-pay | Admitting: Family Medicine

## 2015-10-08 ENCOUNTER — Other Ambulatory Visit: Payer: Self-pay

## 2015-10-08 ENCOUNTER — Telehealth: Payer: Self-pay

## 2015-10-08 ENCOUNTER — Ambulatory Visit
Admission: RE | Admit: 2015-10-08 | Discharge: 2015-10-08 | Disposition: A | Discharge status: Home / Self Care | Payer: 59 | Source: Ambulatory Visit | Attending: Neurology | Admitting: Neurology

## 2015-10-08 ENCOUNTER — Other Ambulatory Visit (HOSPITAL_COMMUNITY)
Admission: RE | Admit: 2015-10-08 | Discharge: 2015-10-08 | Disposition: A | Discharge status: Home / Self Care | Payer: 59 | Source: Ambulatory Visit | Attending: Family Medicine | Admitting: Family Medicine

## 2015-10-08 ENCOUNTER — Ambulatory Visit: Payer: 59 | Attending: Family Medicine | Admitting: Family Medicine

## 2015-10-08 VITALS — BP 134/84 | HR 77 | Temp 98.6°F | Resp 16 | Ht 63.0 in | Wt 259.0 lb

## 2015-10-08 DIAGNOSIS — Z0000 Encounter for general adult medical examination without abnormal findings: Secondary | ICD-10-CM

## 2015-10-08 DIAGNOSIS — Z113 Encounter for screening for infections with a predominantly sexual mode of transmission: Secondary | ICD-10-CM | POA: Diagnosis present

## 2015-10-08 DIAGNOSIS — Z79899 Other long term (current) drug therapy: Secondary | ICD-10-CM | POA: Diagnosis not present

## 2015-10-08 DIAGNOSIS — E7800 Pure hypercholesterolemia, unspecified: Secondary | ICD-10-CM | POA: Insufficient documentation

## 2015-10-08 DIAGNOSIS — M549 Dorsalgia, unspecified: Secondary | ICD-10-CM | POA: Diagnosis not present

## 2015-10-08 DIAGNOSIS — N62 Hypertrophy of breast: Secondary | ICD-10-CM | POA: Insufficient documentation

## 2015-10-08 DIAGNOSIS — N760 Acute vaginitis: Secondary | ICD-10-CM

## 2015-10-08 DIAGNOSIS — Z01419 Encounter for gynecological examination (general) (routine) without abnormal findings: Secondary | ICD-10-CM | POA: Diagnosis present

## 2015-10-08 DIAGNOSIS — D649 Anemia, unspecified: Secondary | ICD-10-CM | POA: Insufficient documentation

## 2015-10-08 DIAGNOSIS — E559 Vitamin D deficiency, unspecified: Secondary | ICD-10-CM | POA: Insufficient documentation

## 2015-10-08 DIAGNOSIS — A499 Bacterial infection, unspecified: Secondary | ICD-10-CM

## 2015-10-08 DIAGNOSIS — G8929 Other chronic pain: Secondary | ICD-10-CM | POA: Diagnosis not present

## 2015-10-08 DIAGNOSIS — Z114 Encounter for screening for human immunodeficiency virus [HIV]: Secondary | ICD-10-CM | POA: Diagnosis not present

## 2015-10-08 DIAGNOSIS — Z1272 Encounter for screening for malignant neoplasm of vagina: Secondary | ICD-10-CM | POA: Insufficient documentation

## 2015-10-08 DIAGNOSIS — B9689 Other specified bacterial agents as the cause of diseases classified elsewhere: Secondary | ICD-10-CM

## 2015-10-08 DIAGNOSIS — R9082 White matter disease, unspecified: Secondary | ICD-10-CM

## 2015-10-08 DIAGNOSIS — R531 Weakness: Secondary | ICD-10-CM

## 2015-10-08 DIAGNOSIS — Z1151 Encounter for screening for human papillomavirus (HPV): Secondary | ICD-10-CM | POA: Diagnosis not present

## 2015-10-08 DIAGNOSIS — Z124 Encounter for screening for malignant neoplasm of cervix: Secondary | ICD-10-CM | POA: Diagnosis not present

## 2015-10-08 LAB — CBC
HCT: 35.9 % — ABNORMAL LOW (ref 36.0–46.0)
HEMOGLOBIN: 11.8 g/dL — AB (ref 12.0–15.0)
MCH: 24.1 pg — AB (ref 26.0–34.0)
MCHC: 32.9 g/dL (ref 30.0–36.0)
MCV: 73.3 fL — AB (ref 78.0–100.0)
MPV: 9.6 fL (ref 8.6–12.4)
PLATELETS: 286 10*3/uL (ref 150–400)
RBC: 4.9 MIL/uL (ref 3.87–5.11)
RDW: 15.3 % (ref 11.5–15.5)
WBC: 10.8 10*3/uL — ABNORMAL HIGH (ref 4.0–10.5)

## 2015-10-08 LAB — LIPID PANEL
CHOL/HDL RATIO: 4.8 ratio (ref <=5.0)
Cholesterol: 253 mg/dL — ABNORMAL HIGH (ref 125–200)
HDL: 53 mg/dL (ref >=46)
LDL Cholesterol: 182 mg/dL — ABNORMAL HIGH (ref <130)
TRIGLYCERIDES: 92 mg/dL (ref <150)
VLDL: 18 mg/dL (ref <30)

## 2015-10-08 LAB — COMPLETE METABOLIC PANEL WITH GFR
ALT: 12 U/L (ref 6–29)
AST: 15 U/L (ref 10–30)
Albumin: 3.9 g/dL (ref 3.6–5.1)
Alkaline Phosphatase: 67 U/L (ref 33–115)
BILIRUBIN TOTAL: 0.9 mg/dL (ref 0.2–1.2)
BUN: 9 mg/dL (ref 7–25)
CHLORIDE: 104 mmol/L (ref 98–110)
CO2: 26 mmol/L (ref 20–31)
Calcium: 9.5 mg/dL (ref 8.6–10.2)
Creat: 0.9 mg/dL (ref 0.50–1.10)
GFR, Est Non African American: 80 mL/min (ref >=60)
GLUCOSE: 97 mg/dL (ref 65–99)
POTASSIUM: 4.5 mmol/L (ref 3.5–5.3)
SODIUM: 140 mmol/L (ref 135–146)
TOTAL PROTEIN: 7.7 g/dL (ref 6.1–8.1)

## 2015-10-08 LAB — POCT URINALYSIS DIPSTICK
Blood, UA: NEGATIVE
Glucose, UA: NEGATIVE
LEUKOCYTES UA: NEGATIVE
Nitrite, UA: NEGATIVE
PH UA: 5.5
Protein, UA: 30
Spec Grav, UA: >=1.03
Urobilinogen, UA: 1

## 2015-10-08 LAB — POCT GLYCOSYLATED HEMOGLOBIN (HGB A1C): HEMOGLOBIN A1C: 5.8

## 2015-10-08 MED ORDER — GADOBENATE DIMEGLUMINE 529 MG/ML IV SOLN
20.0000 mL | Freq: Once | INTRAVENOUS | Status: AC | PRN
  Administered 2015-10-08: 20 mL via INTRAVENOUS

## 2015-10-08 NOTE — Progress Notes (Signed)
Annual physical and pap smear Last pap with normal results  No vaginal discharge, sexually active  No pain today  No suicidal thoughts in the past two weeks

## 2015-10-08 NOTE — Telephone Encounter (Signed)
-----   Message from Drema Dallas, DO sent at 10/08/2015  3:22 PM EST ----- MRI of brain is unchanged compared to 1 and 1/2 years ago.  I don't think findings are of clinical significance.  No follow up MRIs are needed.  She may follow up as needed.

## 2015-10-08 NOTE — Patient Instructions (Addendum)
Carla Lawson was seen today for annual exam.  Diagnoses and all orders for this visit:  Pap smear for cervical cancer screening -     Cytology - PAP   Healthcare maintenance -     POCT urinalysis dipstick -     POCT glycosylated hemoglobin (Hb A1C) -     Flu Vaccine QUAD 36+ mos IM -     MM DIGITAL SCREENING BILATERAL; Future  Morbid obesity, unspecified obesity type (HCC) -     COMPLETE METABOLIC PANEL WITH GFR -     CBC -     Vitamin D, 25-hydroxy  Screening for HIV (human immunodeficiency virus) -     HIV antibody (with reflex)  HYPERCHOLESTEROLEMIA -     Lipid Panel  Chronic back pain  Large breasts   [email or web address] You will be called with lab results   Dr .Armen Pickup

## 2015-10-08 NOTE — Progress Notes (Signed)
SUBJECTIVE:  40 y.o. female for annual routine Pap and checkup.  Social History  Substance Use Topics  . Smoking status: Never Smoker   . Smokeless tobacco: Never Used  . Alcohol Use: No     Comment: occasionally- a few times a year   Current Outpatient Prescriptions  Medication Sig Dispense Refill  . albuterol (PROVENTIL HFA;VENTOLIN HFA) 108 (90 BASE) MCG/ACT inhaler Inhale 2 puffs into the lungs every 6 (six) hours as needed for wheezing or shortness of breath. 1 Inhaler 2  . cholecalciferol (VITAMIN D) 400 UNITS TABS tablet Take 1 tablet (400 Units total) by mouth once a week. 30 each 0  . escitalopram (LEXAPRO) 20 MG tablet Take 1 tablet (20 mg total) by mouth daily. 30 tablet 3  . loratadine (CLARITIN) 10 MG tablet Take 10 mg by mouth daily as needed for allergies.    Marland Kitchen meclizine (ANTIVERT) 25 MG tablet Take 1 tablet (25 mg total) by mouth 3 (three) times daily as needed for dizziness. 30 tablet 0   No current facility-administered medications for this visit.   Allergies: Review of patient's allergies indicates no known allergies.  No LMP recorded.  ROS:  Feeling well. No dyspnea or chest pain on exertion.  No abdominal pain, change in bowel habits, black or bloody stools.  No urinary tract symptoms. GYN ROS: normal menses, no abnormal bleeding, pelvic pain or discharge, no breast pain or new or enlarging lumps on self exam. No neurological complaints. MSK: lower and upper back pain.   OBJECTIVE:  The patient appears well, alert, oriented x 3, in no distress. BP 134/84 mmHg  Pulse 77  Temp(Src) 98.6 F (37 C) (Oral)  Resp 16  Ht 5\' 3"  (1.6 m)  Wt 259 lb (117.482 kg)  BMI 45.89 kg/m2  SpO2 100%  LMP 09/22/2015   ENT normal.  Neck supple. No adenopathy or thyromegaly. PERLA. Lungs are clear, good air entry, no wheezes, rhonchi or rales. S1 and S2 normal, no murmurs, regular rate and rhythm. Abdomen soft without tenderness, guarding, mass or organomegaly. Extremities show no  edema, normal peripheral pulses. Neurological is normal, no focal findings.  BREAST EXAM: breasts appear normal, no suspicious masses, no skin or nipple changes or axillary nodes  PELVIC EXAM: normal external genitalia, vulva, vagina, cervix, uterus and adnexa  Depression screen [facility] 2/9 10/08/2015  Decreased Interest 2  Down, Depressed, Hopeless 2  PHQ - 2 Score 4  Altered sleeping 2  Tired, decreased energy 2  Change in appetite 1  Feeling bad or failure about yourself  1  Trouble concentrating 2  Moving slowly or fidgety/restless 1  Suicidal thoughts 0  PHQ-9 Score 13    GAD 7 : Generalized Anxiety Score 10/08/2015  Nervous, Anxious, on Edge 1  Control/stop worrying 1  Worry too much - different things 1  Trouble relaxing 1  Restless 0  Easily annoyed or irritable 2  Afraid - awful might happen 2  Total GAD 7 Score 8   Carla Lawson was seen today for annual exam.  Diagnoses and all orders for this visit:  Pap smear for cervical cancer screening -     Cytology - PAP [place]  Healthcare maintenance -     POCT urinalysis dipstick -     POCT glycosylated hemoglobin (Hb A1C) -     Flu Vaccine QUAD 36+ mos IM -     MM DIGITAL SCREENING BILATERAL; Future  Morbid obesity, unspecified obesity type (HCC) -  COMPLETE METABOLIC PANEL WITH GFR -     CBC -     Vitamin D, 25-hydroxy  Screening for HIV (human immunodeficiency virus) -     HIV antibody (with reflex)  HYPERCHOLESTEROLEMIA -     Lipid Panel  Chronic back pain  Large breasts  Vitamin D deficiency -     Vitamin D, Ergocalciferol, (DRISDOL) 50000 units CAPS capsule; Take 1 capsule (50,000 Units total) by mouth every 7 (seven) days. For 8 weeks  Anemia, unspecified anemia type -     ferrous sulfate 325 (65 FE) MG tablet; Take 1 tablet (325 mg total) by mouth 2 (two) times daily with a meal.  Other orders -     Cervicovaginal ancillary only

## 2015-10-08 NOTE — Telephone Encounter (Signed)
Message relayed to patient. Verbalized understanding and denied questions.   

## 2015-10-09 DIAGNOSIS — E559 Vitamin D deficiency, unspecified: Secondary | ICD-10-CM | POA: Insufficient documentation

## 2015-10-09 DIAGNOSIS — D649 Anemia, unspecified: Secondary | ICD-10-CM | POA: Insufficient documentation

## 2015-10-09 LAB — CERVICOVAGINAL ANCILLARY ONLY
Chlamydia: NEGATIVE
NEISSERIA GONORRHEA: NEGATIVE
TRICH (WINDOWPATH): NEGATIVE
WET PREP (BD AFFIRM): POSITIVE — AB

## 2015-10-09 LAB — HIV ANTIBODY (ROUTINE TESTING W REFLEX): HIV 1&2 Ab, 4th Generation: NONREACTIVE

## 2015-10-09 LAB — VITAMIN D 25 HYDROXY (VIT D DEFICIENCY, FRACTURES): Vit D, 25-Hydroxy: 11 ng/mL — ABNORMAL LOW (ref 30–100)

## 2015-10-09 LAB — CYTOLOGY - PAP

## 2015-10-09 MED ORDER — VITAMIN D (ERGOCALCIFEROL) 1.25 MG (50000 UNIT) PO CAPS: 50000.0000 [IU] | ORAL_CAPSULE | ORAL | Status: AC

## 2015-10-09 MED ORDER — FLUCONAZOLE 150 MG PO TABS: 150.0000 mg | ORAL_TABLET | Freq: Once | ORAL | Status: AC

## 2015-10-09 MED ORDER — FERROUS SULFATE 325 (65 FE) MG PO TABS: 325.0000 mg | ORAL_TABLET | Freq: Two times a day (BID) | ORAL | Status: AC

## 2015-10-09 MED ORDER — METRONIDAZOLE 500 MG PO TABS: 500.0000 mg | ORAL_TABLET | Freq: Two times a day (BID) | ORAL | Status: AC

## 2015-10-09 NOTE — Assessment & Plan Note (Signed)
Weight loss recommended Low carb diet information provided

## 2015-10-09 NOTE — Addendum Note (Signed)
Addended by: Dessa Phi on: 10/09/2015 07:33 PM   Modules accepted: Orders, SmartSet

## 2015-10-10 ENCOUNTER — Other Ambulatory Visit: Payer: Self-pay | Admitting: Family Medicine

## 2015-10-10 DIAGNOSIS — Z1231 Encounter for screening mammogram for malignant neoplasm of breast: Secondary | ICD-10-CM

## 2015-10-15 ENCOUNTER — Ambulatory Visit: Payer: Self-pay

## 2015-10-23 ENCOUNTER — Encounter (HOSPITAL_COMMUNITY): Payer: Self-pay | Admitting: Psychiatry

## 2015-10-23 ENCOUNTER — Ambulatory Visit (INDEPENDENT_AMBULATORY_CARE_PROVIDER_SITE_OTHER): Payer: 59 | Admitting: Psychiatry

## 2015-10-23 VITALS — BP 128/82 | HR 77 | Ht 63.0 in | Wt 262.4 lb

## 2015-10-23 DIAGNOSIS — F321 Major depressive disorder, single episode, moderate: Secondary | ICD-10-CM | POA: Diagnosis not present

## 2015-10-23 DIAGNOSIS — F411 Generalized anxiety disorder: Secondary | ICD-10-CM | POA: Diagnosis not present

## 2015-10-23 MED ORDER — ESCITALOPRAM OXALATE 20 MG PO TABS: 20.0000 mg | ORAL_TABLET | Freq: Every day | ORAL | Status: AC

## 2015-10-23 NOTE — Progress Notes (Signed)
[facility] M[facility] progress note  Patient Identification:  Carla Lawson Date of Evaluation:  10/23/2015  Subjective--- I have not taken my medicine for the last 2 months as I ran out and had no insurance.    History of present illness --patient seen for medication follow-up today, states after her last visit the Lexapro worked very well and because she missed so many days was fired from her job. She did not have insurance and so did not continue the medication. She states that multiple stressors have occurred since her last visit with me she was directed from her apartment and also broke up with her fianc because he was cheating on her. She is presently living with her sister and this is stressful for her. Her 8 year old son with ADHD and aspirin GERD is reacting to the situation but being on the floors. He also misses patient's fiance who he had a good relationship with.  Patient states the sister is not very supportive and is very critical of them. Patient has started a new job and plans to move out into her own apartment so. She also would like to restart her Lexapro.  States her sleep is poor with middle insomnia, appetite is fair mood is depressed anxious she ruminates a lot at times feels hopeless and helpless, denies suicidal or homicidal ideation no hallucinations or delusions.  Patient will be started on Lexapro 20 today                                                    Notes from initial visit with Dr. Monica Lawson 41 year old African-American female seen for the first time by Dr.Rayette Lawson, patient sees Dr. Michae Lawson on a regular basis. She was recently discharged from IOP where she had been transferred from the inpatient unit because of depression. She was also experiencing dizzy spells and a medical workup did not reveal any cause. As a result of missing a lot of work she was fired from the job. Patient has applied for unemployment and is currently staying with her sister. States that her  66 year old son is urinating on the floors and she is worried about it. Encouraged her to take him to the counselor. Patient continues to being gait and plans to marry her fianc soon. States that her sleep and appetite are good mood is fair feels anxious, denies hopelessness or helplessness denies suicidal or homicidal and has no hallucinations or delusions. She's tolerating her medications well and is coping well.  Marland Kitchen  HPI Review of Systems  Constitutional: Positive for fatigue. Negative for fever, chills, activity change and appetite change.  HENT: Negative for congestion, dental problem and hearing loss.   Eyes: Negative for pain, discharge and visual disturbance.  Respiratory: Negative for cough and chest tightness.   Cardiovascular: Negative for chest pain and palpitations.  Gastrointestinal: Negative for nausea, vomiting, constipation, abdominal distention and anal bleeding.  Endocrine: Negative for polydipsia, polyphagia and polyuria.  Genitourinary: Negative for dysuria, flank pain, vaginal bleeding and difficulty urinating.  Musculoskeletal: Positive for arthralgias.  Skin: Negative for color change and rash.  Allergic/Immunologic: Negative for environmental allergies and food allergies.  Neurological: Negative for dizziness, seizures, syncope, light-headedness and headaches.  Hematological: Negative for adenopathy. Does not bruise/bleed easily.  Psychiatric/Behavioral: Positive for dysphoric mood. The patient is nervous/anxious.    Physical Exam  Depressive  Symptoms: None  (Hypo) Manic Symptoms:  None   Anxiety Symptoms: Excessive rumination   Psychotic Symptoms: None   PTSD Symptoms: None   Traumatic Brain Injury: Negative na  Past Psychiatric History: Diagnosis: major depression  Hospitalizations: 2  Outpatient Care: none  Substance Abuse Care: none  Self-Mutilation: none  Suicidal Attempts:one  Violent Behaviors: none   Past Medical History:   Past  Medical History  Diagnosis Date  . Asthma   . S/P ablation of ventricular arrhythmia   . Anxiety   . Depression   . Brain lesion    History of Loss of Consciousness:  Negative Seizure History:  Negative Cardiac History:  Negative Allergies:  No known allergies Current Medications:  Current Outpatient Prescriptions  Medication Sig Dispense Refill  . albuterol (PROVENTIL HFA;VENTOLIN HFA) 108 (90 BASE) MCG/ACT inhaler Inhale 2 puffs into the lungs every 6 (six) hours as needed for wheezing or shortness of breath. 1 Inhaler 2  . ferrous sulfate 325 (65 FE) MG tablet Take 1 tablet (325 mg total) by mouth 2 (two) times daily with a meal. 60 tablet 3  . fluconazole (DIFLUCAN) 150 MG tablet Take 1 tablet (150 mg total) by mouth once. 1 tablet 0  . loratadine (CLARITIN) 10 MG tablet Take 10 mg by mouth daily as needed for allergies. Reported on 10/08/2015    . meclizine (ANTIVERT) 25 MG tablet Take 1 tablet (25 mg total) by mouth 3 (three) times daily as needed for dizziness. 30 tablet 0  . Vitamin D, Ergocalciferol, (DRISDOL) 50000 units CAPS capsule Take 1 capsule (50,000 Units total) by mouth every 7 (seven) days. For 8 weeks 8 capsule 0   No current facility-administered medications for this visit.    Previous Psychotropic Medications:  Medication Dose   see above list  na                     Substance Abuse History in the last 12 months:none none                                                                                                  Medical Consequences of Substance Abuse: none  Legal Consequences of Substance Abuse: none  Family Consequences of Substance Abuse: none  Blackouts:  Negative DT's:  Negative Withdrawal Symptoms:  Negative None  Social History: Current Place of Residence: [place] Place of Birth: [place] Family Members: has relationship with family members but not that close Marital Status:  Single Children: 1  Sons: 1  Daughters:  0 Relationships: close to fiance Education:  HS Graduate Educational Problems/Performance: good Religious Beliefs/Practices: none reported History of Abuse: none Occupational Experiences; Military History:  None. Legal History: none Hobbies/Interests: none reported  Family History:   Family History  Problem Relation Age of Onset  . Diabetes Father   . Hypertension Father   . Diabetes Brother   . Heart disease Brother   . Drug abuse Brother   . Depression Brother   . Suicidality Brother   . Heart disease Maternal Grandmother   . Cancer Paternal Grandmother   .  Diabetes Paternal Grandmother     Mental Status Examination/Evaluation: Objective:  Appearance: Well Groomed  Patent attorney::  Good  Speech:  Clear and Coherent  Volume:  Normal  Mood:  Anxious and depressed   Affect:  Constricted  Thought Process:  appropriate  Orientation:  Full (Time, Place, and Person)  Thought Content:  WDL   Suicidal Thoughts:  No   Homicidal Thoughts:  No  Judgement:  Good   Insight:  Good   Psychomotor Activity:  Normal  Akathisia:  Negative  Handed:  Right  AIMS (if indicated):  0  Assets:  Communication Skills Desire for Improvement Housing Intimacy Physical Health Resilience Social Support Talents/Skills Transportation Vocational/Educational    Laboratory/X-Ray Psychological Evaluation(s)   none  none   Assessment:  Major depression, recurrent, severe without psychotic features                  Treatment Plan/Recommendations:  Patient will restart  Lexapro 20 mg every day. For her depression and anxiety   see her counselor for therapy.   CBT and relaxation exercises for her anxiety.   Return to clinic in 2 months or sooner if necessary.             Margit Banda, MD 2/15/201711:34 AM

## 2015-10-30 ENCOUNTER — Ambulatory Visit: Payer: Self-pay

## 2015-10-31 ENCOUNTER — Telehealth: Payer: Self-pay | Admitting: Family Medicine

## 2015-10-31 NOTE — Telephone Encounter (Signed)
Pt. Called to speak to nurse regarding a medication she is taking. Please f/u with pt.

## 2015-11-05 ENCOUNTER — Encounter: Payer: Self-pay | Admitting: Clinical

## 2015-11-05 NOTE — Telephone Encounter (Signed)
Pt stated had problem with Vit D. Was taking Vit D without food now taking it at different time and with food with out problems.  Pt stated start taking Lexapro due to personal issues. Requesting letter for work to take a couple of days off

## 2015-11-05 NOTE — Progress Notes (Signed)
Depression screen PHQ 2/9 10/08/2015  Decreased Interest 2  Down, Depressed, Hopeless 2  PHQ - 2 Score 4  Altered sleeping 2  Tired, decreased energy 2  Change in appetite 1  Feeling bad or failure about yourself  1  Trouble concentrating 2  Moving slowly or fidgety/restless 1  Suicidal thoughts 0  PHQ-9 Score 13    GAD 7 : Generalized Anxiety Score 10/08/2015  Nervous, Anxious, on Edge 1  Control/stop worrying 1  Worry too much - different things 1  Trouble relaxing 1  Restless 0  Easily annoyed or irritable 2  Afraid - awful might happen 2  Total GAD 7 Score 8

## 2015-11-05 NOTE — Telephone Encounter (Signed)
Patient will need OV to get a letter for work.

## 2015-11-05 NOTE — Telephone Encounter (Signed)
Pt aware Phone transfer to front office for appointmet

## 2015-11-13 ENCOUNTER — Ambulatory Visit
Admission: RE | Admit: 2015-11-13 | Discharge: 2015-11-13 | Disposition: A | Discharge status: Home / Self Care | Payer: 59 | Source: Ambulatory Visit | Attending: Family Medicine | Admitting: Family Medicine

## 2015-11-13 ENCOUNTER — Other Ambulatory Visit: Payer: Self-pay | Admitting: Family Medicine

## 2015-11-13 DIAGNOSIS — Z1231 Encounter for screening mammogram for malignant neoplasm of breast: Secondary | ICD-10-CM

## 2015-11-20 ENCOUNTER — Ambulatory Visit (HOSPITAL_COMMUNITY): Payer: Self-pay | Admitting: Psychiatry

## 2015-11-27 ENCOUNTER — Other Ambulatory Visit: Payer: Self-pay | Admitting: Family Medicine

## 2015-11-27 DIAGNOSIS — R928 Other abnormal and inconclusive findings on diagnostic imaging of breast: Secondary | ICD-10-CM

## 2015-11-28 ENCOUNTER — Encounter: Payer: Self-pay | Admitting: Family Medicine

## 2015-12-19 ENCOUNTER — Ambulatory Visit (HOSPITAL_COMMUNITY): Payer: Self-pay | Admitting: Psychiatry

## 2015-12-26 ENCOUNTER — Encounter: Payer: Self-pay | Admitting: Family Medicine

## 2015-12-27 MED ORDER — ALBUTEROL SULFATE HFA 108 (90 BASE) MCG/ACT IN AERS: 2.0000 | INHALATION_SPRAY | Freq: Four times a day (QID) | RESPIRATORY_TRACT | Status: AC | PRN

## 2016-01-24 IMAGING — MR MR HEAD W/O CM
8 of 11 series · 27 of 48 positions shown · non-contrast
Comparison: Head CT 11/01/2009

CLINICAL DATA: New onset of dizziness, gait instability, and
intermittent weakness. Evaluate for multiple sclerosis.

EXAM:
MRI HEAD WITHOUT CONTRAST
TECHNIQUE: Multiplanar, multiecho pulse sequences of the brain and surrounding
structures were obtained without intravenous contrast.

[Series 5: DWI · axial · 5.0mm · 1.09mm/px · z∈[-61,+73]mm · 3 of 56 slices shown (1 of 4)]
[im 1/56]
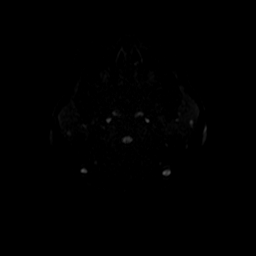
[im 28/56]
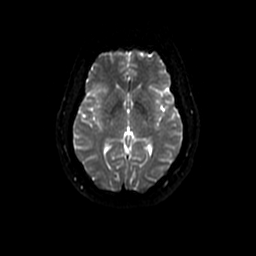
[im 56/56]
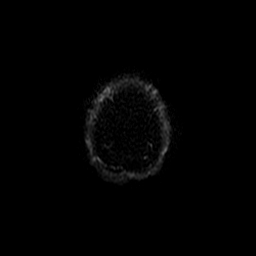

[Series 6: DWI · coronal · 5.0mm · 1.09mm/px · 4 of 62 slices shown (2 of 4)]
[im 1/62]
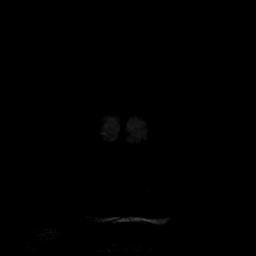
[im 21/62]
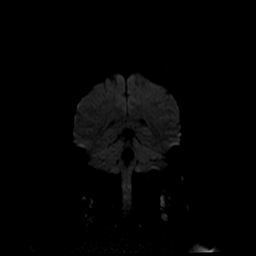
[im 41/62]
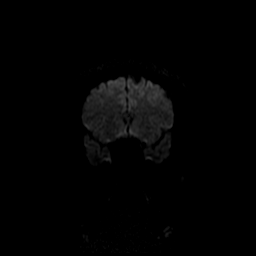
[im 62/62]
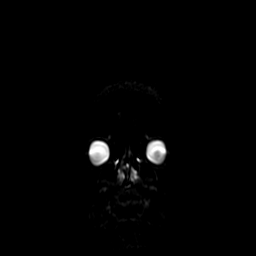

[Series 7: FLAIR · axial · 5.0mm · 0.43mm/px · z∈[-62,+68]mm · 2 of 23 slices shown (1 of 2)]
[im 1/23]
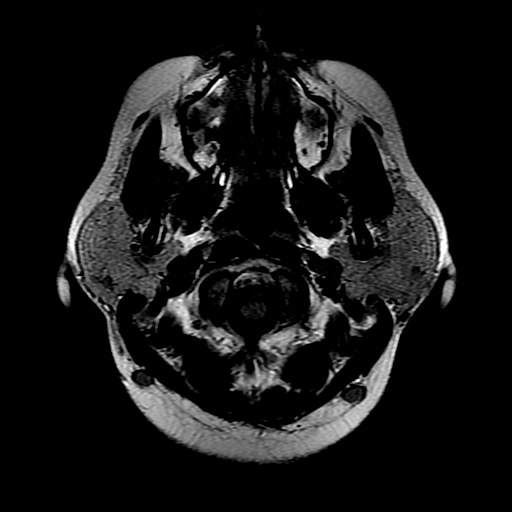
[im 23/23]
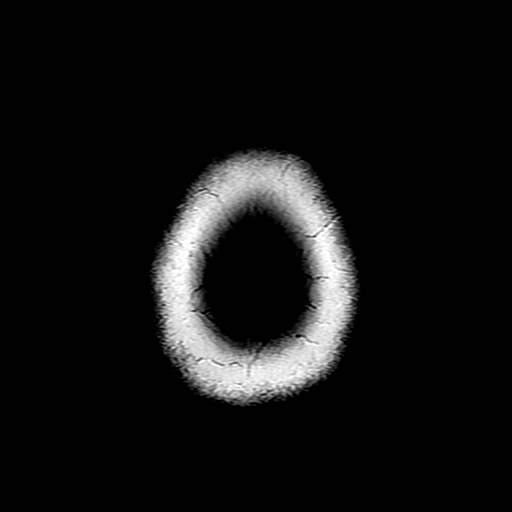

[Series 10: T2 · coronal · 5.0mm · 0.39mm/px · 2 of 24 slices shown (1 of 2)]
[im 1/24]
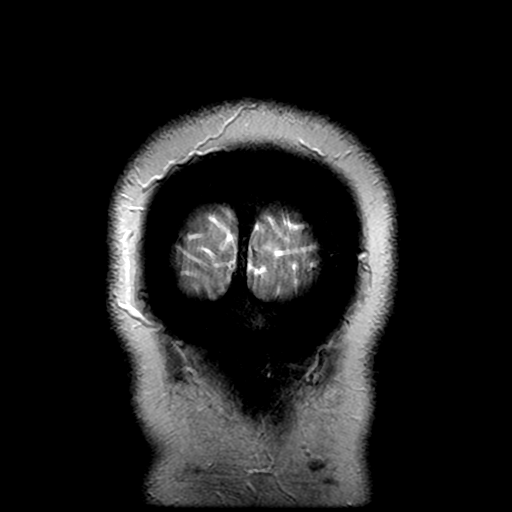
[im 24/24]
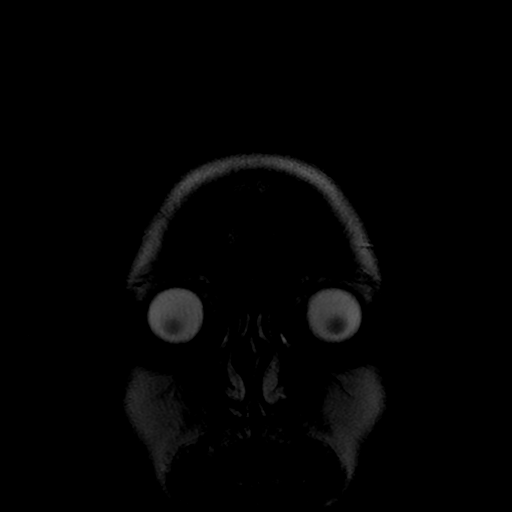

[Series 11: T2 · axial · 5.0mm · 0.43mm/px · 1 of 23 slices shown (2 of 2)]
[im 1/23]
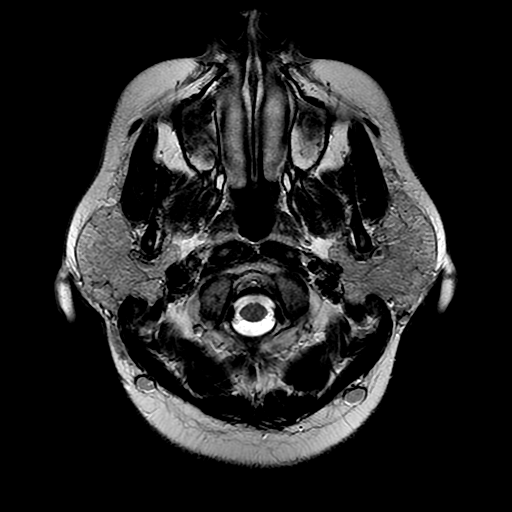

[Series 12: FLAIR · sagittal · 1.2mm · 0.49mm/px · 11 of 320 slices shown (2 of 2)]
[im 16/320]
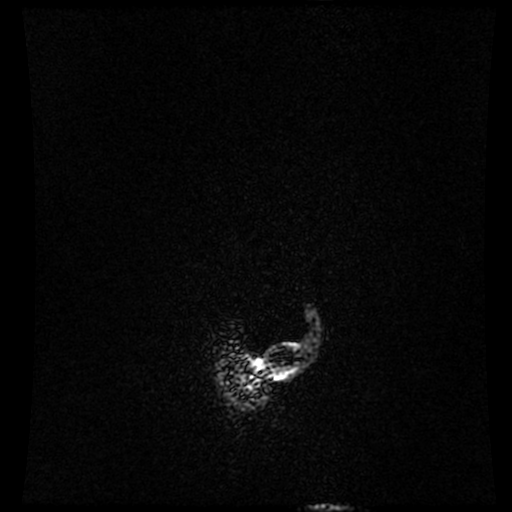
[im 46/320]
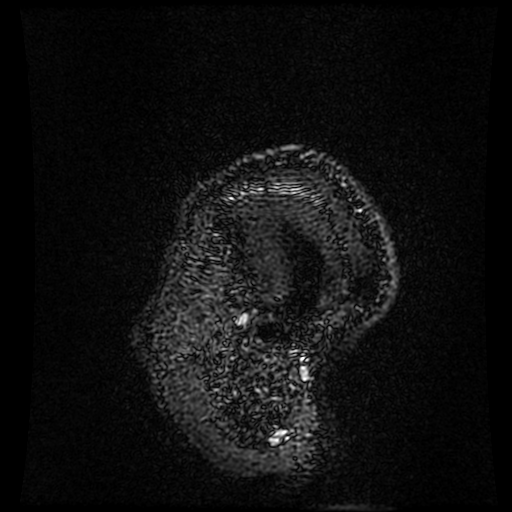
[im 61/320]
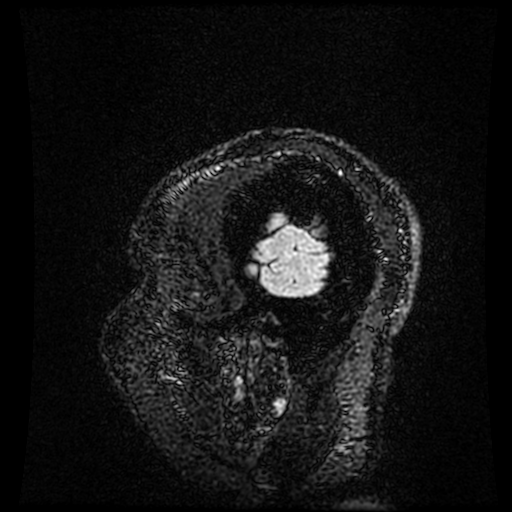
[im 92/320]
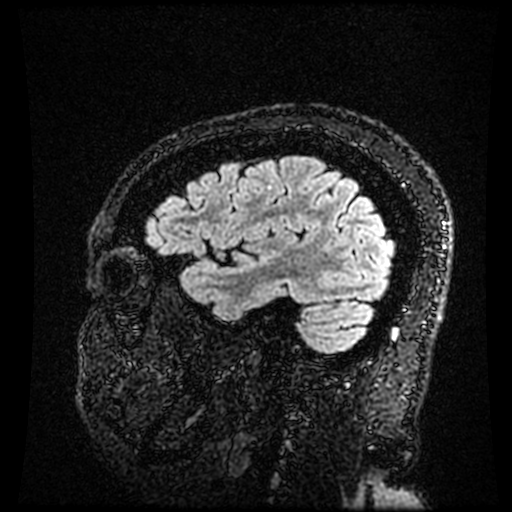
[im 137/320]
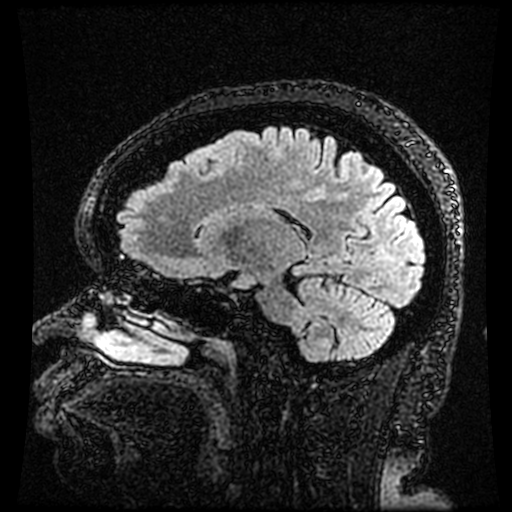
[im 168/320]
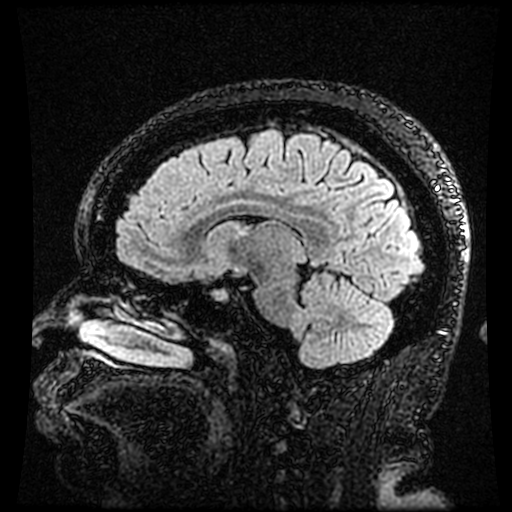
[im 183/320]
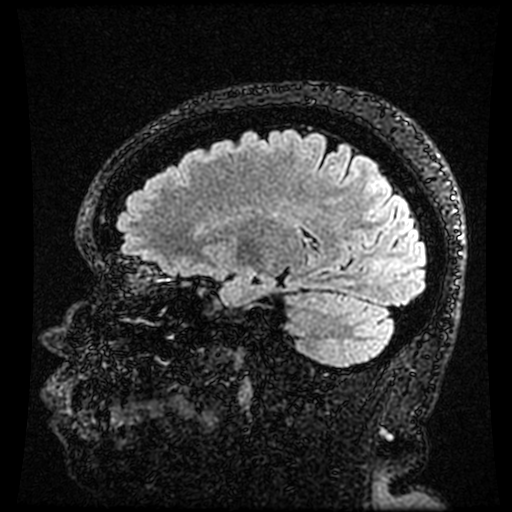
[im 228/320]
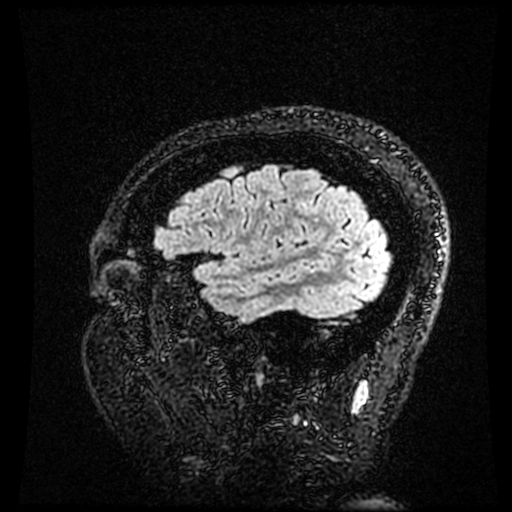
[im 259/320]
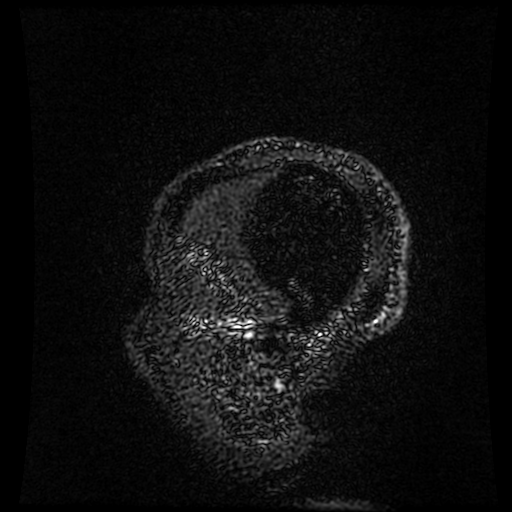
[im 274/320]
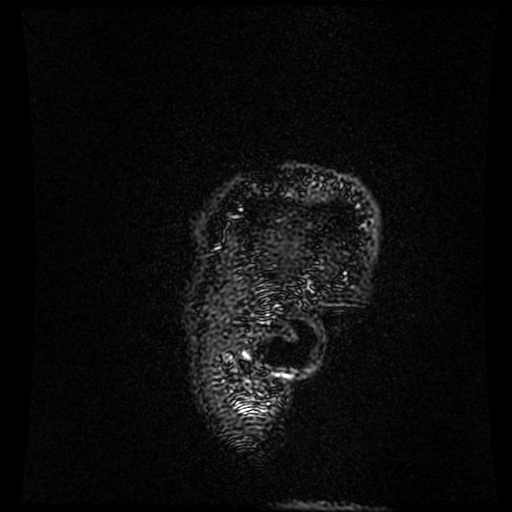
[im 304/320]
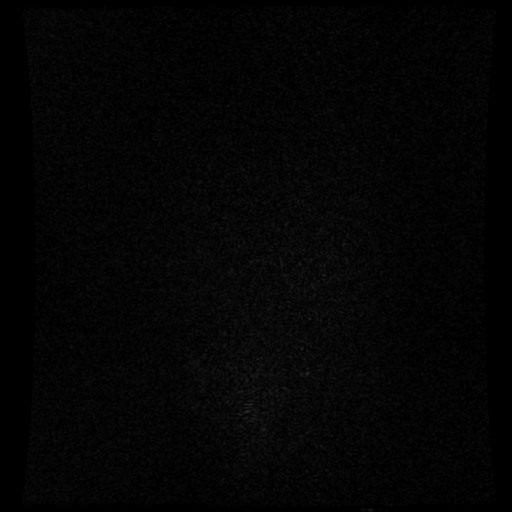

[Series 500: DWI · axial · 5.0mm · 1.09mm/px · z∈[-61,+73]mm · 2 of 28 slices shown (3 of 4)]
[im 1/28]
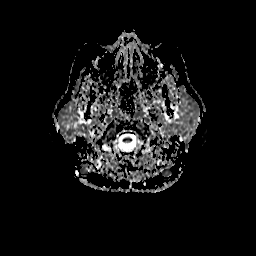
[im 28/28]
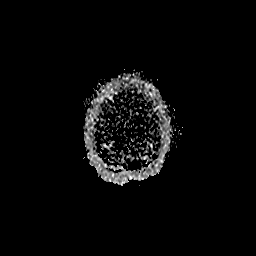

[Series 600: DWI · coronal · 5.0mm · 1.09mm/px · 2 of 31 slices shown (4 of 4)]
[im 1/31]
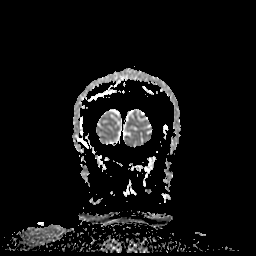
[im 31/31]
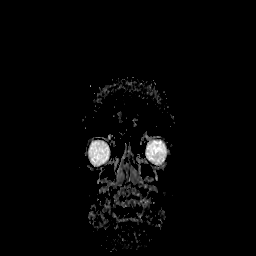

[27 of 48 positions shown; findings below may reference images not displayed]

FINDINGS: There is no evidence of acute infarct, intracranial hemorrhage,
mass, midline shift, or extra-axial fluid collection. There are
several small foci of T2 hyperintensity within the subcortical and
deep cerebral white matter bilaterally, the largest measuring 5 mm
in the left frontal lobe (series 7, image 14). No periventricular,
corpus callosum, or infratentorial lesions are identified.
Ventricles and sulci are within normal limits for age.

Orbits are unremarkable. Paranasal sinuses and mastoid air cells are
clear. Diffuse skull thickening is again seen. Major intracranial
vascular flow voids are preserved.
IMPRESSION: Several small white matter lesions, nonspecific. These could be seen
in the setting of demyelinating disease, however none of these
lesions are specifically characteristic for demyelination. Other
considerations include small vessel ischemia, sequelae of trauma,
hypercoagulable state, vasculitis, migraines, and prior infection.

## 2016-02-04 ENCOUNTER — Encounter: Payer: Self-pay | Admitting: Family Medicine
